# Patient Record
Sex: Male | Born: 1990 | Race: White | Hispanic: No | Marital: Single | Smoking: Never smoker
Health system: Southern US, Community
[De-identification: ages and names within clinical notes are randomized; demographics above are authoritative.]

## PROBLEM LIST (undated history)

## (undated) DIAGNOSIS — K209 Esophagitis, unspecified without bleeding: Secondary | ICD-10-CM

## (undated) DIAGNOSIS — F39 Unspecified mood [affective] disorder: Secondary | ICD-10-CM

## (undated) DIAGNOSIS — F909 Attention-deficit hyperactivity disorder, unspecified type: Secondary | ICD-10-CM

## (undated) HISTORY — DX: Unspecified mood (affective) disorder: F39

## (undated) HISTORY — DX: Esophagitis, unspecified without bleeding: K20.90

## (undated) HISTORY — DX: Attention-deficit hyperactivity disorder, unspecified type: F90.9

---

## 2019-09-08 DIAGNOSIS — K219 Gastro-esophageal reflux disease without esophagitis: Secondary | ICD-10-CM | POA: Diagnosis not present

## 2019-09-08 DIAGNOSIS — K580 Irritable bowel syndrome with diarrhea: Secondary | ICD-10-CM | POA: Diagnosis not present

## 2019-09-18 DIAGNOSIS — E559 Vitamin D deficiency, unspecified: Secondary | ICD-10-CM | POA: Diagnosis not present

## 2019-09-18 DIAGNOSIS — T7800XS Anaphylactic reaction due to unspecified food, sequela: Secondary | ICD-10-CM | POA: Diagnosis not present

## 2019-09-18 DIAGNOSIS — E291 Testicular hypofunction: Secondary | ICD-10-CM | POA: Diagnosis not present

## 2019-09-18 DIAGNOSIS — E349 Endocrine disorder, unspecified: Secondary | ICD-10-CM | POA: Diagnosis not present

## 2020-01-14 DIAGNOSIS — Z1331 Encounter for screening for depression: Secondary | ICD-10-CM | POA: Diagnosis not present

## 2020-01-14 DIAGNOSIS — F640 Transsexualism: Secondary | ICD-10-CM | POA: Diagnosis not present

## 2020-01-14 DIAGNOSIS — Z87890 Personal history of sex reassignment: Secondary | ICD-10-CM | POA: Diagnosis not present

## 2020-03-17 DIAGNOSIS — Z23 Encounter for immunization: Secondary | ICD-10-CM | POA: Diagnosis not present

## 2020-03-31 DIAGNOSIS — Z79818 Long term (current) use of other agents affecting estrogen receptors and estrogen levels: Secondary | ICD-10-CM | POA: Diagnosis not present

## 2020-03-31 DIAGNOSIS — K200 Eosinophilic esophagitis: Secondary | ICD-10-CM | POA: Diagnosis not present

## 2020-03-31 DIAGNOSIS — Z87890 Personal history of sex reassignment: Secondary | ICD-10-CM | POA: Diagnosis not present

## 2020-03-31 DIAGNOSIS — Z79899 Other long term (current) drug therapy: Secondary | ICD-10-CM | POA: Diagnosis not present

## 2020-04-19 DIAGNOSIS — Z1331 Encounter for screening for depression: Secondary | ICD-10-CM | POA: Diagnosis not present

## 2020-04-19 DIAGNOSIS — F909 Attention-deficit hyperactivity disorder, unspecified type: Secondary | ICD-10-CM | POA: Diagnosis not present

## 2020-04-19 DIAGNOSIS — D509 Iron deficiency anemia, unspecified: Secondary | ICD-10-CM | POA: Diagnosis not present

## 2020-04-19 DIAGNOSIS — R112 Nausea with vomiting, unspecified: Secondary | ICD-10-CM | POA: Diagnosis not present

## 2020-04-19 DIAGNOSIS — F649 Gender identity disorder, unspecified: Secondary | ICD-10-CM | POA: Diagnosis not present

## 2020-04-27 DIAGNOSIS — Z20828 Contact with and (suspected) exposure to other viral communicable diseases: Secondary | ICD-10-CM | POA: Diagnosis not present

## 2020-06-11 DIAGNOSIS — F649 Gender identity disorder, unspecified: Secondary | ICD-10-CM | POA: Diagnosis not present

## 2020-06-11 DIAGNOSIS — K929 Disease of digestive system, unspecified: Secondary | ICD-10-CM | POA: Diagnosis not present

## 2020-06-11 DIAGNOSIS — F909 Attention-deficit hyperactivity disorder, unspecified type: Secondary | ICD-10-CM | POA: Diagnosis not present

## 2020-06-11 DIAGNOSIS — D509 Iron deficiency anemia, unspecified: Secondary | ICD-10-CM | POA: Diagnosis not present

## 2020-08-25 DIAGNOSIS — Z79899 Other long term (current) drug therapy: Secondary | ICD-10-CM | POA: Diagnosis not present

## 2020-08-25 DIAGNOSIS — Z87890 Personal history of sex reassignment: Secondary | ICD-10-CM | POA: Diagnosis not present

## 2020-08-25 DIAGNOSIS — F641 Gender identity disorder in adolescence and adulthood: Secondary | ICD-10-CM | POA: Diagnosis not present

## 2020-08-25 DIAGNOSIS — Z79818 Long term (current) use of other agents affecting estrogen receptors and estrogen levels: Secondary | ICD-10-CM | POA: Diagnosis not present

## 2020-08-29 DIAGNOSIS — B029 Zoster without complications: Secondary | ICD-10-CM | POA: Diagnosis not present

## 2020-09-01 DIAGNOSIS — D509 Iron deficiency anemia, unspecified: Secondary | ICD-10-CM | POA: Diagnosis not present

## 2020-09-01 DIAGNOSIS — B029 Zoster without complications: Secondary | ICD-10-CM | POA: Diagnosis not present

## 2020-09-01 DIAGNOSIS — F909 Attention-deficit hyperactivity disorder, unspecified type: Secondary | ICD-10-CM | POA: Diagnosis not present

## 2020-09-01 DIAGNOSIS — F322 Major depressive disorder, single episode, severe without psychotic features: Secondary | ICD-10-CM | POA: Diagnosis not present

## 2020-12-06 DIAGNOSIS — Z79818 Long term (current) use of other agents affecting estrogen receptors and estrogen levels: Secondary | ICD-10-CM | POA: Diagnosis not present

## 2020-12-06 DIAGNOSIS — K200 Eosinophilic esophagitis: Secondary | ICD-10-CM | POA: Diagnosis not present

## 2020-12-06 DIAGNOSIS — Z79899 Other long term (current) drug therapy: Secondary | ICD-10-CM | POA: Diagnosis not present

## 2020-12-06 DIAGNOSIS — Z87890 Personal history of sex reassignment: Secondary | ICD-10-CM | POA: Diagnosis not present

## 2021-01-06 DIAGNOSIS — F064 Anxiety disorder due to known physiological condition: Secondary | ICD-10-CM | POA: Diagnosis not present

## 2021-01-06 DIAGNOSIS — F641 Gender identity disorder in adolescence and adulthood: Secondary | ICD-10-CM | POA: Diagnosis not present

## 2021-01-10 DIAGNOSIS — F909 Attention-deficit hyperactivity disorder, unspecified type: Secondary | ICD-10-CM | POA: Diagnosis not present

## 2021-01-10 DIAGNOSIS — D509 Iron deficiency anemia, unspecified: Secondary | ICD-10-CM | POA: Diagnosis not present

## 2021-01-10 DIAGNOSIS — F649 Gender identity disorder, unspecified: Secondary | ICD-10-CM | POA: Diagnosis not present

## 2021-01-10 DIAGNOSIS — Z87890 Personal history of sex reassignment: Secondary | ICD-10-CM | POA: Diagnosis not present

## 2021-02-14 DIAGNOSIS — F641 Gender identity disorder in adolescence and adulthood: Secondary | ICD-10-CM | POA: Diagnosis not present

## 2021-03-04 DIAGNOSIS — F641 Gender identity disorder in adolescence and adulthood: Secondary | ICD-10-CM | POA: Diagnosis not present

## 2021-04-01 DIAGNOSIS — F641 Gender identity disorder in adolescence and adulthood: Secondary | ICD-10-CM | POA: Diagnosis not present

## 2021-04-15 DIAGNOSIS — F641 Gender identity disorder in adolescence and adulthood: Secondary | ICD-10-CM | POA: Diagnosis not present

## 2021-04-18 DIAGNOSIS — Z79899 Other long term (current) drug therapy: Secondary | ICD-10-CM | POA: Diagnosis not present

## 2021-04-18 DIAGNOSIS — Z87890 Personal history of sex reassignment: Secondary | ICD-10-CM | POA: Diagnosis not present

## 2021-04-18 DIAGNOSIS — F641 Gender identity disorder in adolescence and adulthood: Secondary | ICD-10-CM | POA: Diagnosis not present

## 2021-04-18 DIAGNOSIS — Z79818 Long term (current) use of other agents affecting estrogen receptors and estrogen levels: Secondary | ICD-10-CM | POA: Diagnosis not present

## 2021-04-26 DIAGNOSIS — F649 Gender identity disorder, unspecified: Secondary | ICD-10-CM | POA: Diagnosis not present

## 2021-04-26 DIAGNOSIS — R1013 Epigastric pain: Secondary | ICD-10-CM | POA: Diagnosis not present

## 2021-04-26 DIAGNOSIS — D509 Iron deficiency anemia, unspecified: Secondary | ICD-10-CM | POA: Diagnosis not present

## 2021-04-26 DIAGNOSIS — F909 Attention-deficit hyperactivity disorder, unspecified type: Secondary | ICD-10-CM | POA: Diagnosis not present

## 2021-04-29 DIAGNOSIS — F641 Gender identity disorder in adolescence and adulthood: Secondary | ICD-10-CM | POA: Diagnosis not present

## 2021-05-09 DIAGNOSIS — F641 Gender identity disorder in adolescence and adulthood: Secondary | ICD-10-CM | POA: Diagnosis not present

## 2021-05-16 ENCOUNTER — Other Ambulatory Visit: Payer: Self-pay

## 2021-05-16 ENCOUNTER — Encounter: Payer: Self-pay | Admitting: Family Medicine

## 2021-05-16 ENCOUNTER — Ambulatory Visit (INDEPENDENT_AMBULATORY_CARE_PROVIDER_SITE_OTHER): Payer: BC Managed Care – PPO | Admitting: Family Medicine

## 2021-05-16 VITALS — BP 110/60 | HR 83 | Ht 65.5 in | Wt 220.0 lb

## 2021-05-16 DIAGNOSIS — F39 Unspecified mood [affective] disorder: Secondary | ICD-10-CM | POA: Diagnosis not present

## 2021-05-16 DIAGNOSIS — K209 Esophagitis, unspecified without bleeding: Secondary | ICD-10-CM

## 2021-05-16 DIAGNOSIS — R198 Other specified symptoms and signs involving the digestive system and abdomen: Secondary | ICD-10-CM | POA: Diagnosis not present

## 2021-05-16 DIAGNOSIS — Z789 Other specified health status: Secondary | ICD-10-CM | POA: Diagnosis not present

## 2021-05-16 DIAGNOSIS — F909 Attention-deficit hyperactivity disorder, unspecified type: Secondary | ICD-10-CM | POA: Insufficient documentation

## 2021-05-16 DIAGNOSIS — K2100 Gastro-esophageal reflux disease with esophagitis, without bleeding: Secondary | ICD-10-CM | POA: Insufficient documentation

## 2021-05-16 MED ORDER — SPIRONOLACTONE 100 MG PO TABS: 100.0000 mg | ORAL_TABLET | Freq: Every day | ORAL | 3 refills | Status: DC

## 2021-05-16 MED ORDER — ESTRADIOL VALERATE 20 MG/ML IM OIL: 20.0000 mg | TOPICAL_OIL | INTRAMUSCULAR | 12 refills | Status: DC

## 2021-05-16 MED ORDER — PANTOPRAZOLE SODIUM 40 MG PO TBEC: 40.0000 mg | DELAYED_RELEASE_TABLET | Freq: Every day | ORAL | 3 refills | Status: DC

## 2021-05-16 NOTE — Progress Notes (Signed)
ROI completed to [facility] ( Dr. Delman Cheadle). ROI given to [facility] in the front office to process. Christen Bame, CMA

## 2021-05-16 NOTE — Patient Instructions (Addendum)
It was wonderful to see you today.  Please bring ALL of your medications with you to every visit.   Today we talked about:   -Signing a release for [facility]  - Return in 1 month refill medications    Thank you for choosing [facility].   Please call [phone] with any questions about today's appointment.  Please be sure to schedule follow up at the front  desk before you leave today.   Dorris Singh, MD  Family Medicine

## 2021-05-16 NOTE — Progress Notes (Signed)
°  Patient Name: Sean Graves Date of Birth: [birth year] Date of Visit: 05/16/21 PCP: Sean Malay, MD  Chief Complaint: establish care   Subjective: Sean Graves is a pleasant 31 y.o. 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 Was seeing Sean Graves at [facility]. .   Preferred name: [name]  Pronouns: she/they  Patient goals for today: discuss overall health, review medications   Age at which began to identify body did not match gender: 9 (maybe even earlier)  Support system: Partner- Sean Graves, mom to some degree  People who know patient is out as transgender: family, friends   Any history of hormone therapy use: yes  Current Outpatient Medications:    estradiol valerate (DELESTROGEN) 20 MG/ML injection, Inject 1 mL (20 mg total) into the muscle every 28 (twenty-eight) days., Disp: 5 mL, Rfl: 12   pantoprazole (PROTONIX) 40 MG tablet, Take 1 tablet (40 mg total) by mouth daily., Disp: 30 tablet, Rfl: 3   spironolactone (ALDACTONE) 100 MG tablet, Take 1 tablet (100 mg total) by mouth at bedtime., Disp: 90 tablet, Rfl: 3  Does not tuck   Any history of gender affirming surgeries: no  Do you desire to undergo gender affirming surgery: yes- on wait list at [facility]  PMH:  MDD- follows with [facility]  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 And Eosinophilia eosphagitis  VTE- No  CAD- NO   PSH:  none  Family History:  Alcohol use in mother and father Depression in mother    Social History: Sexual orientation: male partner- Sean Graves  Tobacco use: no Alcohol use: minimal- alcohol use disorder runs in family  Other substances: no  Oldest of 2 other brothers Mother is somewhat supportive Father- has not seen since age 9 Step dad- not supportive  Partner is named Sean Graves 2 dogs, no cats  - Mental health: Sees [facility] - Adherence to medication: good  - Adverse reactions  to medication: patches and pills did not work   ROS:  ROS  + intermittent diarrhea and constipation NO melena No family history of IBD or colon cancer  Vitals:   05/16/21 1013  BP: 110/60  Pulse: 83  SpO2: 98%   Filed Weights   05/16/21 1013  Weight: 220 lb (99.8 kg)    Cardiac: Regular rate and rhythm. Normal S1/S2. No murmurs, rubs, or gallops appreciated. Lungs: Clear bilaterally to ascultation.  Abdomen: Normoactive bowel sounds. No tenderness to deep or light palpation. No rebound or guarding.   Psych: Pleasant and appropriate    Sean Graves was seen today for new patient (initial visit).  Diagnoses and all orders for this visit:  Male-to-male transgender person -     estradiol valerate (DELESTROGEN) 20 MG/ML injection; Inject 1 mL (20 mg total) into the muscle every 28 (twenty-eight) days. -     spironolactone (ALDACTONE) 100 MG tablet; Take 1 tablet (100 mg total) by mouth at bedtime.  Esophagitis -     pantoprazole (PROTONIX) 40 MG tablet; Take 1 tablet (40 mg total) by mouth daily.  Alternating constipation and diarrhea discussed, had previously referral to Gastroenterology.   Mood disorder ([place]) continue with therapy. Briefly discussed medications   Attention deficit hyperactivity disorder (ADHD), unspecified ADHD type previously on Vyvanse  HCM At follow up review vaccine records    Sean Singh, MD  [facility] Medicine Teaching Service

## 2021-05-23 DIAGNOSIS — F641 Gender identity disorder in adolescence and adulthood: Secondary | ICD-10-CM | POA: Diagnosis not present

## 2021-06-20 ENCOUNTER — Other Ambulatory Visit: Payer: Self-pay

## 2021-06-20 ENCOUNTER — Other Ambulatory Visit (HOSPITAL_COMMUNITY)
Admission: RE | Admit: 2021-06-20 | Discharge: 2021-06-20 | Disposition: A | Discharge status: Home / Self Care | Payer: BC Managed Care – PPO | Source: Ambulatory Visit | Attending: Family Medicine | Admitting: Family Medicine

## 2021-06-20 ENCOUNTER — Encounter: Payer: Self-pay | Admitting: Family Medicine

## 2021-06-20 ENCOUNTER — Ambulatory Visit (INDEPENDENT_AMBULATORY_CARE_PROVIDER_SITE_OTHER): Payer: BC Managed Care – PPO | Admitting: Family Medicine

## 2021-06-20 ENCOUNTER — Ambulatory Visit (HOSPITAL_COMMUNITY)
Admission: RE | Admit: 2021-06-20 | Discharge: 2021-06-20 | Disposition: A | Discharge status: Home / Self Care | Payer: BC Managed Care – PPO | Source: Ambulatory Visit | Attending: Family Medicine | Admitting: Family Medicine

## 2021-06-20 ENCOUNTER — Telehealth: Payer: Self-pay | Admitting: Family Medicine

## 2021-06-20 VITALS — BP 109/53 | HR 57 | Wt 222.4 lb

## 2021-06-20 DIAGNOSIS — Z113 Encounter for screening for infections with a predominantly sexual mode of transmission: Secondary | ICD-10-CM | POA: Insufficient documentation

## 2021-06-20 DIAGNOSIS — Z789 Other specified health status: Secondary | ICD-10-CM | POA: Diagnosis not present

## 2021-06-20 DIAGNOSIS — N50812 Left testicular pain: Secondary | ICD-10-CM

## 2021-06-20 DIAGNOSIS — N503 Cyst of epididymis: Secondary | ICD-10-CM | POA: Diagnosis not present

## 2021-06-20 DIAGNOSIS — Z5181 Encounter for therapeutic drug level monitoring: Secondary | ICD-10-CM | POA: Diagnosis not present

## 2021-06-20 DIAGNOSIS — R1032 Left lower quadrant pain: Secondary | ICD-10-CM

## 2021-06-20 DIAGNOSIS — K219 Gastro-esophageal reflux disease without esophagitis: Secondary | ICD-10-CM | POA: Insufficient documentation

## 2021-06-20 DIAGNOSIS — Z79899 Other long term (current) drug therapy: Secondary | ICD-10-CM | POA: Diagnosis not present

## 2021-06-20 MED ORDER — ESTRADIOL VALERATE 20 MG/ML IM OIL: TOPICAL_OIL | INTRAMUSCULAR | 2 refills | Status: DC

## 2021-06-20 MED ORDER — NAPROXEN 500 MG PO TABS: 500.0000 mg | ORAL_TABLET | Freq: Two times a day (BID) | ORAL | 0 refills | Status: DC

## 2021-06-20 MED ORDER — SPIRONOLACTONE 100 MG PO TABS: 100.0000 mg | ORAL_TABLET | Freq: Every day | ORAL | 3 refills | Status: DC

## 2021-06-20 NOTE — Telephone Encounter (Signed)
Called with results.

## 2021-06-20 NOTE — Patient Instructions (Addendum)
It was wonderful to see you today.  Please bring ALL of your medications with you to every visit.   Today we talked about:  --Getting an  ultrasound--I will call you with results  -- Going to the lab--I will call you with results  ---I sent your medications to your pharmacy   Go to the main entrance of [facility] by 1:15 PM--you can go to admitting and they will direct you to radiology    Thank you for choosing [facility].   Please call [phone] with any questions about today's appointment.  Please be sure to schedule follow up at the front  desk before you leave today.   Dorris Singh, MD  Family Medicine

## 2021-06-20 NOTE — Assessment & Plan Note (Signed)
Monitoring labs today, refills sent to pharmacy

## 2021-06-20 NOTE — Progress Notes (Signed)
° ° °  SUBJECTIVE:   CHIEF COMPLAINT: testicular pain HPI:   Sean Graves is a 31 y.o.  with history notable for GERD and MTF transgender status presenting for left testicle pain.  She has had testicular pain X2 weeks. Intermittent, diffuse. Started out more severe then went away. Has been intermittent since then. Uncertain trigger, no trauma. Does NOT tuck. No discharge. Has has vaginal intercourse with partner. Does intermittently have pain with erections. Is seeking surgical therapy with orchiectomy this summer. Cannot identify one specific area that is most painful.  She is somewhat disappointed in body changes with hormones.  Using spironolactone 100 mg, 0.3 mL of 100/5 Ml of estrogren. No complications. No symptoms of VTE. No family or personal history of VTE or CV disease. Does not smoke   PERTINENT  PMH / PSH/Family/Social History : updated and reviewed   OBJECTIVE:   BP (!) 109/53    Pulse (!) 57    Wt 100.9 kg    SpO2 99%    BMI 36.45 kg/m   Today's weight:  Last Weight  Most recent update: 06/20/2021 11:32 AM    Weight  100.9 kg (222 lb 6.4 oz)            Review of prior weights: Filed Weights   06/20/21 1131  Weight: 100.9 kg   RRR Lungs clear bilaterally  Chaperoned exam Sean Graves, CMA) + mild penile and testicular atrophy No TTP  Normal cremasteric reflex  No discharge or rash   ASSESSMENT/PLAN:   Male-to-male transgender person Monitoring labs today, refills sent to pharmacy     Testicular pain- possible epididymo-orchitis, given intermittent symptoms, testicular torsion still possible, Ordered stat ultrasound, will send to ER if absence of blood flow to L testicle. Has been ongoing for two weeks so does make this less likely but must be considered. No signs of rash suggestive of infection. GC/CT sent.   Healthcare maintenance records have been requested to obtain vaccine records.  She reports she was tested for all STIs before becoming sexually active  with her current partner.  Will await prior records before repeating.    Dorris Singh, [place]

## 2021-06-21 ENCOUNTER — Telehealth: Payer: Self-pay | Admitting: Family Medicine

## 2021-06-21 DIAGNOSIS — N452 Orchitis: Secondary | ICD-10-CM

## 2021-06-21 LAB — COMPREHENSIVE METABOLIC PANEL
ALT: 15 IU/L (ref 0–44)
AST: 20 IU/L (ref 0–40)
Albumin/Globulin Ratio: 1.8 (ref 1.2–2.2)
Albumin: 4.2 g/dL (ref 4.1–5.2)
Alkaline Phosphatase: 54 IU/L (ref 44–121)
BUN/Creatinine Ratio: 11 (ref 9–20)
BUN: 8 mg/dL (ref 6–20)
Bilirubin Total: <0.2 mg/dL (ref 0.0–1.2)
CO2: 20 mmol/L (ref 20–29)
Calcium: 8.8 mg/dL (ref 8.7–10.2)
Chloride: 102 mmol/L (ref 96–106)
Creatinine, Ser: 0.71 mg/dL — ABNORMAL LOW (ref 0.76–1.27)
Globulin, Total: 2.3 g/dL (ref 1.5–4.5)
Glucose: 84 mg/dL (ref 70–99)
Potassium: 4.6 mmol/L (ref 3.5–5.2)
Sodium: 135 mmol/L (ref 134–144)
Total Protein: 6.5 g/dL (ref 6.0–8.5)
eGFR: 128 mL/min/{1.73_m2} (ref >59)

## 2021-06-21 LAB — LIPID PANEL
Chol/HDL Ratio: 3.8 ratio (ref 0.0–5.0)
Cholesterol, Total: 160 mg/dL (ref 100–199)
HDL: 42 mg/dL (ref >39)
LDL Chol Calc (NIH): 99 mg/dL (ref 0–99)
Triglycerides: 101 mg/dL (ref 0–149)
VLDL Cholesterol Cal: 19 mg/dL (ref 5–40)

## 2021-06-21 LAB — URINE CYTOLOGY ANCILLARY ONLY
Chlamydia: NEGATIVE
Comment: NEGATIVE
Comment: NORMAL
Neisseria Gonorrhea: NEGATIVE

## 2021-06-21 LAB — TESTOSTERONE: Testosterone: 14 ng/dL — ABNORMAL LOW (ref 264–916)

## 2021-06-21 LAB — ESTRADIOL: Estradiol: 224 pg/mL — ABNORMAL HIGH (ref 7.6–42.6)

## 2021-06-21 MED ORDER — LEVOFLOXACIN 500 MG PO TABS: 500.0000 mg | ORAL_TABLET | Freq: Every day | ORAL | 0 refills | Status: DC

## 2021-06-21 NOTE — Telephone Encounter (Signed)
Called patient to discuss results.  Reviewed at length.  Given ongoing left testicular pain we will treat for colitis.  Given negative GC chlamydia and low risk we will treat for presumed enteric infection.  Discussed risks of Levaquin.  Prescribed for 7-day course.  She is to call regarding improvement or change in symptoms.

## 2021-06-29 ENCOUNTER — Encounter: Payer: Self-pay | Admitting: Family Medicine

## 2021-07-04 DIAGNOSIS — F641 Gender identity disorder in adolescence and adulthood: Secondary | ICD-10-CM | POA: Diagnosis not present

## 2021-07-25 ENCOUNTER — Other Ambulatory Visit: Payer: Self-pay

## 2021-07-25 ENCOUNTER — Encounter: Payer: Self-pay | Admitting: Family Medicine

## 2021-07-25 ENCOUNTER — Ambulatory Visit (INDEPENDENT_AMBULATORY_CARE_PROVIDER_SITE_OTHER): Payer: BC Managed Care – PPO | Admitting: Family Medicine

## 2021-07-25 VITALS — BP 125/74 | HR 67 | Wt 220.8 lb

## 2021-07-25 DIAGNOSIS — F909 Attention-deficit hyperactivity disorder, unspecified type: Secondary | ICD-10-CM | POA: Diagnosis not present

## 2021-07-25 DIAGNOSIS — F39 Unspecified mood [affective] disorder: Secondary | ICD-10-CM

## 2021-07-25 DIAGNOSIS — Z789 Other specified health status: Secondary | ICD-10-CM | POA: Diagnosis not present

## 2021-07-25 NOTE — Assessment & Plan Note (Signed)
Discussed at length.  Given positive MDQ referral to psychiatry for recommendations regarding best medication given the patient's comorbid symptoms. ?

## 2021-07-25 NOTE — Assessment & Plan Note (Signed)
Reviewed recent labs.  

## 2021-07-25 NOTE — Patient Instructions (Addendum)
It was wonderful to see you today. ? ?Please bring ALL of your medications with you to every visit.  ? ?Today we talked about: ? ?I have referred you to Psychiatry to further evaluate your concern. If you do not received a phone call about this appointment within 2 weeks, please call our office back at [phone]. Jazmin Hartsell coordinates our referrals and can assist you in this.  ? ? ?Dr. Francesca Jewett  ?Monday July 3rd ?2 PM ?[address]&& ?[place] [address] ? ? ?You also have a visit June 7th at 2 PM ? ?Please follow up in 2 months  ? ?Please let me know if testicular pain changes ?  ? ? ?Thank you for choosing [facility].  ? ?Please call [phone] with any questions about today's appointment. ? ?Please be sure to schedule follow up at the front  desk before you leave today.  ? ?Dorris Singh, MD  ?Family Medicine  ? ?

## 2021-07-25 NOTE — Assessment & Plan Note (Addendum)
Has moderate symptoms but I do wonder if anxiety is the greater contributor here.  See plan under mood disorder. ?

## 2021-07-25 NOTE — Progress Notes (Signed)
? ? ?  SUBJECTIVE:  ? ?CHIEF COMPLAINT: ADHD and mood ?HPI:  ? ?Sean Graves is a 31 y.o.  with history notable for MTF transgender status, mood disorder, and ADHD presenting for questions about medications. ? ?The patient has a history of mood disorder, diagnosed with early onset (?) MDD and GAD. MDQ is positive today in three areas. Also had diagnosis of ADHD since age 62. Had tics with Adderall. Finds her anxiety is biggest issue. Reports some days anxiety is so bad she cannot focus or leave cough. No SI or HI. She fluctuates from sadness to anxiety. ADHD was improved by medication. Has been off for several years. She is interested in starting medication for mood and ADHD in future.She is about to graduate with a degree in [employer] studies.  ? ?Patient has upcoming visits with [facility] for potential gender affirming surgeries. Finds significant benefit from hormones. Does notice when she is nadir of injections.  ? ?She reports her testicular pain is resolved but did recur much more mildly this weekend. No discharge or other new symptoms.  ? ?PERTINENT  PMH / PSH/Family/Social History : updated and reviewed as appropriate  ? ?OBJECTIVE:  ? ?BP 125/74   Pulse 67   Wt 220 lb 12.8 oz (100.2 kg)   SpO2 100%   BMI 36.18 kg/m?   ?Today's weight:  ?Last Weight  Most recent update: 07/25/2021  9:21 AM  ? ? Weight  ?100.2 kg (220 lb 12.8 oz)  ?      ? ?  ? ?Review of prior weights: ?Filed Weights  ? 07/25/21 0921  ?Weight: 220 lb 12.8 oz (100.2 kg)  ? ? ? ?Cardiac: Regular rate and rhythm. Normal S1/S2. No murmurs, rubs, or gallops appreciated. ?Lungs: Clear bilaterally to ascultation.  ?Psych: Pleasant and appropriate  ? ? ?ASSESSMENT/PLAN:  ? ?ADHD ?Has moderate symptoms but I do wonder if anxiety is the greater contributor here.  See plan under mood disorder. ? ?Mood disorder ([place]) ?Discussed at length.  Given positive MDQ referral to psychiatry for recommendations regarding best medication given the patient's comorbid  symptoms. ? ?Male-to-male transgender person ?Reviewed recent labs. ?Follow up in 2 months for labs ? ?Healthcare maintenance, had hep C and HIV testing  at prior institution. ? ? ? ?Dorris Singh, MD  ?Family Medicine Teaching Service  ?[facility]  ? ? ?

## 2021-07-26 ENCOUNTER — Telehealth: Payer: Self-pay | Admitting: *Deleted

## 2021-07-26 NOTE — Chronic Care Management (AMB) (Signed)
?  Care Management  ? ?Note ? ?07/26/2021 ?Name: Sean Graves MRN: [id number] DOB: April 04, 1991 ? ?Sean Graves is a 31 y.o. year old adult who is a primary care patient of Martyn Malay, MD. I reached out to [name] by phone today offer care coordination services.  ? ?Ms. Geng was given information about care management services today including:  ?Care management services include personalized support from designated clinical staff supervised by her physician, including individualized plan of care and coordination with other care providers ?24/7 contact phone numbers for assistance for urgent and routine care needs. ?The patient may stop care management services at any time by phone call to the office staff. ? ?Patient agreed to services and verbal consent obtained.  ? ?Follow up plan: ?Telephone appointment with care management team member scheduled for:08/17/21 ? ?Laverda Sorenson  ?Care Guide, Embedded Care Coordination ?[facility]  Care Management  ?Direct Dial: [phone] ? ?

## 2021-08-01 DIAGNOSIS — F641 Gender identity disorder in adolescence and adulthood: Secondary | ICD-10-CM | POA: Diagnosis not present

## 2021-08-15 DIAGNOSIS — F641 Gender identity disorder in adolescence and adulthood: Secondary | ICD-10-CM | POA: Diagnosis not present

## 2021-08-17 ENCOUNTER — Ambulatory Visit: Payer: BC Managed Care – PPO | Admitting: Licensed Clinical Social Worker

## 2021-08-17 NOTE — Chronic Care Management (AMB) (Signed)
?  Care Management  ? ?Social Work Visit Note ? ?08/17/2021 ?Name: Sean Graves MRN: [id number] DOB: February 04, 1991 ? ?Trindon Dorton is a 31 y.o. year old adult who sees Martyn Malay, MD for primary care. The care management team was consulted for assistance with care management and care coordination needs related to [facility] Resources   ? ?Patient was given the following information about care management and care coordination services today, agreed to services, and gave verbal consent: 1.care management/care coordination services include personalized support from designated clinical staff supervised by their physician, including individualized plan of care and coordination with other care providers 2. 24/7 contact phone numbers for assistance for urgent and routine care needs. 3. The patient may stop care management/care coordination services at any time by phone call to the office staff. ? ?Engaged with patient by telephone for initial visit in response to provider referral for social work chronic care management and care coordination services. ? ?Assessment: Review of patient history, allergies, and health status during evaluation of patient need for care management/care coordination services.   ? ?Interventions:  ?Patient interviewed and appropriate assessments performed ?Collaborated with clinical team regarding patient needs  ?Patient in need of a psychiatrist. Patient has an upcoming appointment to meet with a therapist or Psychiatrist. Patient was unsure.  ?SW emailed patient Psychology today website . Patient is looking for a psychiatrist who specializes with working with [facility] community.  SW also advised patient to contact [employer] to get listing of psychiatrist in network.  ?SW gave patient contact information if needed in the future. ? ?SDOH (Social Determinants of Health) assessments performed: Yes ?   ? ?Plan:  ?patient will work with BSW to address needs related to Mental Health Concerns  ?No further follow up  required: . ?Milus Height, BSW  ?Social Worker ?[facility] Care Management  ?[phone] ?  ? ? ? ? ? ? ? ? ? ? ? ? ? ? ?

## 2021-08-17 NOTE — Patient Instructions (Signed)
Visit Information ? ?Instructions:  ? ?Patient was given the following information about care management and care coordination services today, agreed to services, and gave verbal consent: 1.care management/care coordination services include personalized support from designated clinical staff supervised by their physician, including individualized plan of care and coordination with other care providers 2. 24/7 contact phone numbers for assistance for urgent and routine care needs. 3. The patient may stop care management/care coordination services at any time by phone call to the office staff. ? ?Patient verbalizes understanding of instructions and care plan provided today and agrees to view in [place]. Active MyChart status confirmed with patient.   ? ?No further follow up required: Patient has SW information if assistance is needed in the future. ? ?Milus Height, BSW  ?Social Worker ?[facility] Care Management  ?[phone] ?  ? ?  ?

## 2021-08-29 DIAGNOSIS — F641 Gender identity disorder in adolescence and adulthood: Secondary | ICD-10-CM | POA: Diagnosis not present

## 2021-09-08 ENCOUNTER — Telehealth: Payer: Self-pay | Admitting: Family Medicine

## 2021-09-08 NOTE — Telephone Encounter (Signed)
Received after-hours page from [phone].  Attempted to return phone call, however patient did not answer.  Left HIPAA safe voicemail.  Will retry in 5 minutes. ? ?Ezequiel Essex, MD ? ?

## 2021-09-08 NOTE — Telephone Encounter (Signed)
Was able to reach the patient on phone, confirmed patient DOB. ? ?Intermittent pain in right forefoot x 1.5 weeks, worsening in last 3 days. Patient concerned they may have stress fracture and asks for advice on care at urgent care vs ED.  ? ?- no blunt or penetrating trauma to area ?- pain with flexing toes ?- denies redness, local swelling, or warmth ?- skin of toes normal in color, denies dusky, blue or black toes ?- full ROM in toes ? ?Recommend [facility] appointment or urgent care along with elevation and ice to the area.  I do not believe this complaint warrants an ED visit as there is no apparent vascular compromise present. ? ?Ezequiel Essex, MD ? ?

## 2021-09-21 DIAGNOSIS — F641 Gender identity disorder in adolescence and adulthood: Secondary | ICD-10-CM | POA: Diagnosis not present

## 2021-10-03 ENCOUNTER — Encounter: Payer: Self-pay | Admitting: Family Medicine

## 2021-10-03 ENCOUNTER — Ambulatory Visit (INDEPENDENT_AMBULATORY_CARE_PROVIDER_SITE_OTHER): Payer: BC Managed Care – PPO | Admitting: Family Medicine

## 2021-10-03 VITALS — BP 122/70 | HR 74 | Ht 65.5 in | Wt 218.0 lb

## 2021-10-03 DIAGNOSIS — K2100 Gastro-esophageal reflux disease with esophagitis, without bleeding: Secondary | ICD-10-CM

## 2021-10-03 DIAGNOSIS — Z789 Other specified health status: Secondary | ICD-10-CM | POA: Diagnosis not present

## 2021-10-03 MED ORDER — "SYRINGE/NEEDLE (DISP) 23G X 1"" 1 ML MISC": 1 refills | Status: AC

## 2021-10-03 MED ORDER — "EASY TOUCH HYPODERMIC NEEDLE 25G X 5/8"" MISC": 3 refills | Status: AC

## 2021-10-03 NOTE — Assessment & Plan Note (Signed)
Monitoring labs ordered she will obtain them later this week.  She is to call for an appointment.  Refilled syringes and needles.  She is finding benefit from the medications and thus we will continue these at this time.  Will write letter for bottom surgery.

## 2021-10-03 NOTE — Assessment & Plan Note (Signed)
Doing well on PPI.  Will continue.  Consider transition to famotidine in future.

## 2021-10-03 NOTE — Progress Notes (Signed)
    SUBJECTIVE:   CHIEF COMPLAINT: letter for surgery  HPI:   Sean Graves is a 31 y.o.  with history notable for mood disorder and MTF transgender status  presenting for follow up and for a letter for surgery.  The patient has no particular concerns today.  She needs a refill on her syringes and needles.  She does report that she feels like she is having menstrual type symptoms at the end of her injection cycle.  She reports she is pleased overall with the injections and feels well.  She has a consultation with Dr. Francesca Jewett on 29 June for bottom surgery.  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  She is very pleased with this.  PERTINENT  PMH / PSH/Family/Social History : Updated and reviewed as appropriate no new family or medical history  OBJECTIVE:   BP 122/70   Pulse 74   Ht 5' 5.5" (1.664 m)   Wt 218 lb (98.9 kg)   SpO2 98%   BMI 35.73 kg/m   Today's weight:  Last Weight  Most recent update: 10/03/2021  8:25 AM    Weight  98.9 kg (218 lb)            Review of prior weights: [employer]   10/03/21 0825  Weight: 218 lb (98.9 kg)     Cardiac: Regular rate and rhythm. Normal S1/S2. No murmurs, rubs, or gallops appreciated. Lungs: Clear bilaterally to ascultation.  Psych: Pleasant and appropriate    ASSESSMENT/PLAN:   Reflux esophagitis Doing well on PPI.  Will continue.  Consider transition to famotidine in future.  Male-to-male transgender person Monitoring labs ordered she will obtain them later this week.  She is to call for an appointment.  Refilled syringes and needles.  She is finding benefit from the medications and thus we will continue these at this time.  Will write letter for bottom surgery.       Dorris Singh, [place]

## 2021-10-03 NOTE — Patient Instructions (Signed)
It was wonderful to see you today.  Please bring ALL of your medications with you to every visit.   Today we talked about:  --A letter for Dr. Francesca Jewett  - I will send this via [facility] - Return later this week for labs--please call to let us know the date you are coming in   - I sent in syringes with needles and your injecting needlese   Thank you for choosing [facility].   Please call [phone] with any questions about today's appointment.  Please be sure to schedule follow up at the front  desk before you leave today.   Dorris Singh, MD  Family Medicine

## 2021-10-04 ENCOUNTER — Encounter: Payer: Self-pay | Admitting: Family Medicine

## 2021-10-04 ENCOUNTER — Encounter: Payer: Self-pay | Admitting: *Deleted

## 2021-10-04 DIAGNOSIS — F641 Gender identity disorder in adolescence and adulthood: Secondary | ICD-10-CM | POA: Diagnosis not present

## 2021-10-04 LAB — HIV ANTIBODY (ROUTINE TESTING W REFLEX): HIV Screen 4th Generation wRfx: NONREACTIVE

## 2021-10-04 LAB — CBC
Hematocrit: 33.8 % — ABNORMAL LOW (ref 37.5–51.0)
Hemoglobin: 10.8 g/dL — ABNORMAL LOW (ref 13.0–17.7)
MCH: 26.9 pg (ref 26.6–33.0)
MCHC: 32 g/dL (ref 31.5–35.7)
MCV: 84 fL (ref 79–97)
Platelets: 282 10*3/uL (ref 150–450)
RBC: 4.02 x10E6/uL — ABNORMAL LOW (ref 4.14–5.80)
RDW: 13.2 % (ref 11.6–15.4)
WBC: 10.5 10*3/uL (ref 3.4–10.8)

## 2021-10-04 LAB — COMPREHENSIVE METABOLIC PANEL
ALT: 14 IU/L (ref 0–44)
AST: 15 IU/L (ref 0–40)
Albumin/Globulin Ratio: 1.6 (ref 1.2–2.2)
Albumin: 3.8 g/dL — ABNORMAL LOW (ref 4.1–5.2)
Alkaline Phosphatase: 68 IU/L (ref 44–121)
BUN/Creatinine Ratio: 10 (ref 9–20)
BUN: 8 mg/dL (ref 6–20)
Bilirubin Total: <0.2 mg/dL (ref 0.0–1.2)
CO2: 22 mmol/L (ref 20–29)
Calcium: 9.1 mg/dL (ref 8.7–10.2)
Chloride: 102 mmol/L (ref 96–106)
Creatinine, Ser: 0.82 mg/dL (ref 0.76–1.27)
Globulin, Total: 2.4 g/dL (ref 1.5–4.5)
Glucose: 108 mg/dL — ABNORMAL HIGH (ref 70–99)
Potassium: 4.2 mmol/L (ref 3.5–5.2)
Sodium: 138 mmol/L (ref 134–144)
Total Protein: 6.2 g/dL (ref 6.0–8.5)
eGFR: 123 mL/min/{1.73_m2} (ref >59)

## 2021-10-04 LAB — HCV INTERPRETATION

## 2021-10-04 LAB — TESTOSTERONE: Testosterone: 17 ng/dL — ABNORMAL LOW (ref 264–916)

## 2021-10-04 LAB — HCV AB W REFLEX TO QUANT PCR: HCV Ab: NONREACTIVE

## 2021-10-04 LAB — ESTRADIOL: Estradiol: 167 pg/mL — ABNORMAL HIGH (ref 7.6–42.6)

## 2021-10-06 ENCOUNTER — Other Ambulatory Visit: Payer: Self-pay | Admitting: Family Medicine

## 2021-10-06 DIAGNOSIS — D649 Anemia, unspecified: Secondary | ICD-10-CM

## 2021-10-17 ENCOUNTER — Ambulatory Visit (HOSPITAL_BASED_OUTPATIENT_CLINIC_OR_DEPARTMENT_OTHER): Payer: BC Managed Care – PPO | Admitting: Psychiatry

## 2021-10-17 ENCOUNTER — Encounter (HOSPITAL_COMMUNITY): Payer: Self-pay | Admitting: Psychiatry

## 2021-10-17 ENCOUNTER — Ambulatory Visit (HOSPITAL_COMMUNITY): Payer: Self-pay | Admitting: Psychiatry

## 2021-10-17 VITALS — Wt 218.0 lb

## 2021-10-17 DIAGNOSIS — F902 Attention-deficit hyperactivity disorder, combined type: Secondary | ICD-10-CM | POA: Diagnosis not present

## 2021-10-17 DIAGNOSIS — F411 Generalized anxiety disorder: Secondary | ICD-10-CM

## 2021-10-17 DIAGNOSIS — T1490XA Injury, unspecified, initial encounter: Secondary | ICD-10-CM

## 2021-10-17 DIAGNOSIS — F331 Major depressive disorder, recurrent, moderate: Secondary | ICD-10-CM

## 2021-10-17 MED ORDER — BUPROPION HCL ER (XL) 150 MG PO TB24: 150.0000 mg | ORAL_TABLET | Freq: Every day | ORAL | 0 refills | Status: DC

## 2021-10-17 MED ORDER — HYDROXYZINE PAMOATE 25 MG PO CAPS: 25.0000 mg | ORAL_CAPSULE | Freq: Every day | ORAL | 0 refills | Status: DC | PRN
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
## 2021-10-22 DIAGNOSIS — F641 Gender identity disorder in adolescence and adulthood: Secondary | ICD-10-CM | POA: Diagnosis not present

## 2021-10-24 DIAGNOSIS — Z6835 Body mass index (BMI) 35.0-35.9, adult: Secondary | ICD-10-CM | POA: Diagnosis not present

## 2021-10-24 DIAGNOSIS — F640 Transsexualism: Secondary | ICD-10-CM | POA: Diagnosis not present

## 2021-10-24 DIAGNOSIS — F649 Gender identity disorder, unspecified: Secondary | ICD-10-CM | POA: Diagnosis not present

## 2021-11-11 ENCOUNTER — Other Ambulatory Visit (HOSPITAL_COMMUNITY): Payer: Self-pay | Admitting: Psychiatry

## 2021-11-11 DIAGNOSIS — T1490XA Injury, unspecified, initial encounter: Secondary | ICD-10-CM

## 2021-11-11 DIAGNOSIS — F411 Generalized anxiety disorder: Secondary | ICD-10-CM

## 2021-11-11 DIAGNOSIS — F902 Attention-deficit hyperactivity disorder, combined type: Secondary | ICD-10-CM

## 2021-11-11 DIAGNOSIS — F331 Major depressive disorder, recurrent, moderate: Secondary | ICD-10-CM

## 2021-11-14 ENCOUNTER — Ambulatory Visit (HOSPITAL_COMMUNITY): Payer: Self-pay | Admitting: Psychiatry

## 2021-11-16 ENCOUNTER — Telehealth (HOSPITAL_BASED_OUTPATIENT_CLINIC_OR_DEPARTMENT_OTHER): Payer: BC Managed Care – PPO | Admitting: Psychiatry

## 2021-11-16 ENCOUNTER — Encounter (HOSPITAL_COMMUNITY): Payer: Self-pay | Admitting: Psychiatry

## 2021-11-16 DIAGNOSIS — F902 Attention-deficit hyperactivity disorder, combined type: Secondary | ICD-10-CM

## 2021-11-16 DIAGNOSIS — F411 Generalized anxiety disorder: Secondary | ICD-10-CM | POA: Diagnosis not present

## 2021-11-16 DIAGNOSIS — F331 Major depressive disorder, recurrent, moderate: Secondary | ICD-10-CM | POA: Diagnosis not present

## 2021-11-16 DIAGNOSIS — T1490XA Injury, unspecified, initial encounter: Secondary | ICD-10-CM | POA: Diagnosis not present

## 2021-11-16 MED ORDER — HYDROXYZINE PAMOATE 25 MG PO CAPS: 25.0000 mg | ORAL_CAPSULE | Freq: Every day | ORAL | 0 refills | Status: DC | PRN

## 2021-11-16 MED ORDER — BUPROPION HCL ER (XL) 300 MG PO TB24: 300.0000 mg | ORAL_TABLET | Freq: Every day | ORAL | 0 refills | Status: DC
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
## 2022-02-09 ENCOUNTER — Other Ambulatory Visit (HOSPITAL_COMMUNITY): Payer: Self-pay | Admitting: Psychiatry

## 2022-02-09 DIAGNOSIS — F331 Major depressive disorder, recurrent, moderate: Secondary | ICD-10-CM

## 2022-02-09 DIAGNOSIS — F902 Attention-deficit hyperactivity disorder, combined type: Secondary | ICD-10-CM

## 2022-02-09 DIAGNOSIS — Z62819 Personal history of unspecified abuse in childhood: Secondary | ICD-10-CM

## 2022-02-09 DIAGNOSIS — T1490XA Injury, unspecified, initial encounter: Secondary | ICD-10-CM

## 2022-02-09 DIAGNOSIS — F411 Generalized anxiety disorder: Secondary | ICD-10-CM

## 2022-02-15 ENCOUNTER — Telehealth (HOSPITAL_BASED_OUTPATIENT_CLINIC_OR_DEPARTMENT_OTHER): Payer: 59 | Admitting: Psychiatry

## 2022-02-15 ENCOUNTER — Encounter (HOSPITAL_COMMUNITY): Payer: Self-pay | Admitting: Psychiatry

## 2022-02-15 DIAGNOSIS — F902 Attention-deficit hyperactivity disorder, combined type: Secondary | ICD-10-CM | POA: Diagnosis not present

## 2022-02-15 DIAGNOSIS — F411 Generalized anxiety disorder: Secondary | ICD-10-CM

## 2022-02-15 DIAGNOSIS — F331 Major depressive disorder, recurrent, moderate: Secondary | ICD-10-CM | POA: Diagnosis not present

## 2022-02-15 DIAGNOSIS — T1490XA Injury, unspecified, initial encounter: Secondary | ICD-10-CM

## 2022-02-15 DIAGNOSIS — R69 Illness, unspecified: Secondary | ICD-10-CM | POA: Diagnosis not present

## 2022-02-15 MED ORDER — HYDROXYZINE PAMOATE 25 MG PO CAPS: 25.0000 mg | ORAL_CAPSULE | Freq: Every day | ORAL | 0 refills | Status: DC | PRN

## 2022-02-15 MED ORDER — BUPROPION HCL ER (XL) 300 MG PO TB24: 300.0000 mg | ORAL_TABLET | Freq: Every day | ORAL | 0 refills | Status: DC
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
## 2022-05-11 ENCOUNTER — Other Ambulatory Visit (HOSPITAL_COMMUNITY): Payer: Self-pay | Admitting: Psychiatry

## 2022-05-11 DIAGNOSIS — F411 Generalized anxiety disorder: Secondary | ICD-10-CM

## 2022-05-11 DIAGNOSIS — T1490XA Injury, unspecified, initial encounter: Secondary | ICD-10-CM

## 2022-05-11 DIAGNOSIS — F902 Attention-deficit hyperactivity disorder, combined type: Secondary | ICD-10-CM

## 2022-05-11 DIAGNOSIS — F331 Major depressive disorder, recurrent, moderate: Secondary | ICD-10-CM

## 2022-05-15 ENCOUNTER — Other Ambulatory Visit (HOSPITAL_COMMUNITY): Payer: Self-pay | Admitting: *Deleted

## 2022-05-15 DIAGNOSIS — F902 Attention-deficit hyperactivity disorder, combined type: Secondary | ICD-10-CM

## 2022-05-15 DIAGNOSIS — T1490XA Injury, unspecified, initial encounter: Secondary | ICD-10-CM

## 2022-05-15 DIAGNOSIS — F411 Generalized anxiety disorder: Secondary | ICD-10-CM

## 2022-05-15 DIAGNOSIS — F331 Major depressive disorder, recurrent, moderate: Secondary | ICD-10-CM

## 2022-05-15 DIAGNOSIS — Z62819 Personal history of unspecified abuse in childhood: Secondary | ICD-10-CM

## 2022-05-15 MED ORDER — BUPROPION HCL ER (XL) 300 MG PO TB24: 300.0000 mg | ORAL_TABLET | Freq: Every day | ORAL | 0 refills | Status: DC

## 2022-05-17 ENCOUNTER — Telehealth (HOSPITAL_COMMUNITY): Payer: 59 | Admitting: Psychiatry

## 2022-05-27 ENCOUNTER — Other Ambulatory Visit (HOSPITAL_COMMUNITY): Payer: Self-pay | Admitting: Psychiatry

## 2022-05-27 DIAGNOSIS — F411 Generalized anxiety disorder: Secondary | ICD-10-CM

## 2022-05-27 DIAGNOSIS — T1490XA Injury, unspecified, initial encounter: Secondary | ICD-10-CM

## 2022-05-27 DIAGNOSIS — F331 Major depressive disorder, recurrent, moderate: Secondary | ICD-10-CM

## 2022-05-27 DIAGNOSIS — F902 Attention-deficit hyperactivity disorder, combined type: Secondary | ICD-10-CM

## 2022-05-30 ENCOUNTER — Telehealth (HOSPITAL_BASED_OUTPATIENT_CLINIC_OR_DEPARTMENT_OTHER): Payer: Self-pay | Admitting: Psychiatry

## 2022-05-30 ENCOUNTER — Encounter (HOSPITAL_COMMUNITY): Payer: Self-pay | Admitting: Psychiatry

## 2022-05-30 DIAGNOSIS — F331 Major depressive disorder, recurrent, moderate: Secondary | ICD-10-CM

## 2022-05-30 DIAGNOSIS — F902 Attention-deficit hyperactivity disorder, combined type: Secondary | ICD-10-CM

## 2022-05-30 DIAGNOSIS — F411 Generalized anxiety disorder: Secondary | ICD-10-CM

## 2022-05-30 DIAGNOSIS — T1490XA Injury, unspecified, initial encounter: Secondary | ICD-10-CM

## 2022-05-30 MED ORDER — BUPROPION HCL ER (XL) 300 MG PO TB24: 300.0000 mg | ORAL_TABLET | Freq: Every day | ORAL | 0 refills | Status: DC

## 2022-05-30 MED ORDER — HYDROXYZINE PAMOATE 25 MG PO CAPS: 25.0000 mg | ORAL_CAPSULE | Freq: Every day | ORAL | 0 refills | Status: DC | PRN
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
## 2022-06-21 ENCOUNTER — Ambulatory Visit: Payer: Medicaid Other | Admitting: Family Medicine

## 2022-06-26 ENCOUNTER — Ambulatory Visit: Payer: Medicaid Other | Admitting: Student

## 2022-06-26 ENCOUNTER — Encounter: Payer: Self-pay | Admitting: Student

## 2022-06-26 VITALS — BP 120/72 | HR 68 | Ht 65.5 in | Wt 218.0 lb

## 2022-06-26 DIAGNOSIS — Z5181 Encounter for therapeutic drug level monitoring: Secondary | ICD-10-CM | POA: Diagnosis not present

## 2022-06-26 DIAGNOSIS — D229 Melanocytic nevi, unspecified: Secondary | ICD-10-CM

## 2022-06-26 DIAGNOSIS — Z789 Other specified health status: Secondary | ICD-10-CM

## 2022-06-26 DIAGNOSIS — Z79899 Other long term (current) drug therapy: Secondary | ICD-10-CM

## 2022-06-26 MED ORDER — ESTRADIOL VALERATE 20 MG/ML IM OIL: TOPICAL_OIL | INTRAMUSCULAR | 2 refills | Status: DC

## 2022-06-26 NOTE — Assessment & Plan Note (Signed)
Monitoring labs ordered today (estradiol and testosterone). Stable and doing well on current HRT regimen. Continues to benefit from them.

## 2022-06-26 NOTE — Progress Notes (Unsigned)
    SUBJECTIVE:   CHIEF COMPLAINT / HPI:   Sean Graves is a pleasant 32 year-old with history of mood disorder and MTF transgender status presenting for hormone level check.  She has no acute concerns today.   PERTINENT  PMH / PSH: ***  OBJECTIVE:   There were no vitals taken for this visit. ***   ASSESSMENT/PLAN:   Male-to-male transgender person Monitoring labs ordered today (estradiol and testosterone). Stable and doing well on current HRT regimen. Continues to benefit from them.     Orvis Brill, Ali Molina    {    This will disappear when note is signed, click to select method of visit    :1}

## 2022-06-26 NOTE — Patient Instructions (Signed)
It was great seeing you today.  I refilled your medicine.  We collected testing today- I will call you if it is abnormal. If your results are normal, I will send you a MyChart message.   If you have any questions or concerns, please feel free to call the clinic.   Have a wonderful day,  Dr. Orvis Brill [facility] [phone]

## 2022-06-26 NOTE — Progress Notes (Deleted)
    SUBJECTIVE:   CHIEF COMPLAINT / HPI:   [place] Lost health insurance-  Approved for Medicaid  PERTINENT  PMH / PSH:   OBJECTIVE:   BP 120/72   Pulse 68   Ht 5' 5.5 (1.664 m)   Wt 218 lb (98.9 kg)   SpO2 96%   BMI 35.73 kg/m     ASSESSMENT/PLAN:   Male-to-male transgender person Monitoring labs ordered today (estradiol  and testosterone ). Stable and doing well on current HRT regimen. Continues to benefit from them.    Nachman Sundt, DO [facility]    {    This will disappear when note is signed, click to select method of visit    :1}

## 2022-06-27 DIAGNOSIS — D229 Melanocytic nevi, unspecified: Secondary | ICD-10-CM | POA: Insufficient documentation

## 2022-06-27 LAB — TESTOSTERONE: Testosterone: 12 ng/dL — ABNORMAL LOW (ref 264–916)

## 2022-06-27 LAB — ESTRADIOL: Estradiol: 261 pg/mL — ABNORMAL HIGH (ref 7.6–42.6)

## 2022-06-27 NOTE — Assessment & Plan Note (Signed)
No concerning features for malignancy Patient will monitor for any skin changes

## 2022-07-31 ENCOUNTER — Other Ambulatory Visit: Payer: Self-pay

## 2022-07-31 DIAGNOSIS — K209 Esophagitis, unspecified without bleeding: Secondary | ICD-10-CM

## 2022-07-31 MED ORDER — PANTOPRAZOLE SODIUM 40 MG PO TBEC: 40.0000 mg | DELAYED_RELEASE_TABLET | Freq: Every day | ORAL | 3 refills | Status: DC

## 2022-08-11 ENCOUNTER — Other Ambulatory Visit: Payer: Self-pay

## 2022-08-11 DIAGNOSIS — Z789 Other specified health status: Secondary | ICD-10-CM

## 2022-08-11 MED ORDER — SPIRONOLACTONE 100 MG PO TABS: 100.0000 mg | ORAL_TABLET | Freq: Every day | ORAL | 3 refills | Status: DC

## 2022-08-25 ENCOUNTER — Other Ambulatory Visit (HOSPITAL_COMMUNITY): Payer: Self-pay | Admitting: Psychiatry

## 2022-08-25 DIAGNOSIS — F411 Generalized anxiety disorder: Secondary | ICD-10-CM

## 2022-08-25 DIAGNOSIS — F331 Major depressive disorder, recurrent, moderate: Secondary | ICD-10-CM

## 2022-08-25 DIAGNOSIS — T1490XA Injury, unspecified, initial encounter: Secondary | ICD-10-CM

## 2022-08-25 DIAGNOSIS — F902 Attention-deficit hyperactivity disorder, combined type: Secondary | ICD-10-CM

## 2022-08-29 ENCOUNTER — Telehealth (HOSPITAL_BASED_OUTPATIENT_CLINIC_OR_DEPARTMENT_OTHER): Payer: Medicaid Other | Admitting: Psychiatry

## 2022-08-29 ENCOUNTER — Encounter (HOSPITAL_COMMUNITY): Payer: Self-pay | Admitting: Psychiatry

## 2022-08-29 DIAGNOSIS — T1490XA Injury, unspecified, initial encounter: Secondary | ICD-10-CM | POA: Diagnosis not present

## 2022-08-29 DIAGNOSIS — F331 Major depressive disorder, recurrent, moderate: Secondary | ICD-10-CM | POA: Diagnosis not present

## 2022-08-29 DIAGNOSIS — F411 Generalized anxiety disorder: Secondary | ICD-10-CM

## 2022-08-29 DIAGNOSIS — F902 Attention-deficit hyperactivity disorder, combined type: Secondary | ICD-10-CM | POA: Diagnosis not present

## 2022-08-29 MED ORDER — BUPROPION HCL ER (XL) 300 MG PO TB24
300.0000 mg | ORAL_TABLET | Freq: Every day | ORAL | 0 refills | Status: DC
Start: 2022-08-29 — End: 2022-11-28

## 2022-08-29 MED ORDER — HYDROXYZINE PAMOATE 25 MG PO CAPS: 25.0000 mg | ORAL_CAPSULE | Freq: Every day | ORAL | 0 refills | Status: DC | PRN
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
## 2022-11-09 ENCOUNTER — Ambulatory Visit: Payer: Medicaid Other | Admitting: Family Medicine

## 2022-11-13 ENCOUNTER — Encounter: Payer: Self-pay | Admitting: Family Medicine

## 2022-11-13 ENCOUNTER — Ambulatory Visit: Payer: Medicaid Other | Admitting: Family Medicine

## 2022-11-13 VITALS — BP 117/76 | HR 71 | Ht 65.0 in | Wt 228.4 lb

## 2022-11-13 DIAGNOSIS — Z789 Other specified health status: Secondary | ICD-10-CM | POA: Diagnosis present

## 2022-11-13 DIAGNOSIS — M545 Low back pain, unspecified: Secondary | ICD-10-CM | POA: Diagnosis not present

## 2022-11-13 NOTE — Patient Instructions (Addendum)
Thank you for coming in today!  Things we discussed today: For your low back pain, alternate over the counter tylenol and ibuprofen as needed. You can continue to use ice (or heat) as well. Also try the stretches on the handout (attached) 2. Take your spironolactone in the morning and see if this helps with getting up in the middle of the night to use the bathroom.  3. We will check your E and T levels today as well as your electrolytes. 4. Remember to take an iron supplement!  Please make an appointment to be seen in 3 months.  Have a great day!

## 2022-11-13 NOTE — Assessment & Plan Note (Addendum)
Intermittent back pain that is worse with physical activity and better with rest and ice likely to be musculoskeletal etiology. No red flag symptoms. Alternate ibuprofen and tylenol as needed, stretching, rest, and ice/heat Reviewed return precautions.

## 2022-11-13 NOTE — Assessment & Plan Note (Addendum)
Doing well on estradiol injections Take spironolactone in the AM to decrease nighttime urination Check estradiol and testosterone levels (q3 months) If T levels remain low and still having excessive urination, can consider decreasing spironolactone dose  Annual CMP today Follow up in 3 months

## 2022-11-13 NOTE — Progress Notes (Signed)
    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  Cardiovascular:     Rate and Rhythm: Normal rate and regular rhythm.     Heart sounds: Normal heart sounds, S1 normal and S2 normal.  Pulmonary:     Effort: Pulmonary effort is normal.     Breath sounds: Normal breath sounds and air entry.  Musculoskeletal:     Cervical back: Normal. No tenderness.     Thoracic back: Normal. No tenderness.     Lumbar back: Tenderness present.     Comments: Mild tenderness to palpation at L4/L5 Negative straight leg raise bilaterally  Neurological:     Mental Status: She is alert.     Gait: Gait is intact.      ASSESSMENT/PLAN:   Male-to-male transgender person Doing well on estradiol injections Take spironolactone in the AM to decrease nighttime urination Check estradiol and testosterone levels (q3 months) If T levels remain low and still having excessive urination, can consider decreasing spironolactone dose  Annual CMP today Follow up in 3  months  Low back pain Intermittent back pain that is worse with physical activity and better with rest and ice likely to be musculoskeletal etiology. No red flag symptoms. Alternate ibuprofen and tylenol as needed, stretching, rest, and ice/heat Reviewed return precautions.    Patient Instructions  Thank you for coming in today!  Things we discussed today: For your low back pain, alternate over the counter tylenol and ibuprofen as needed. You can continue to use ice (or heat) as well. Also try the stretches on the handout (attached) 2. Take your spironolactone in the morning and see if this helps with getting up in the middle of the night to use the bathroom.  3. We will check your E and T levels today as well as your electrolytes. 4. Remember to take an iron supplement!  Please make an appointment to be seen in 3 months.  Have a great day!    Lorayne Bender, MD [facility]

## 2022-11-14 LAB — COMPREHENSIVE METABOLIC PANEL
ALT: 13 IU/L (ref 0–44)
AST: 15 IU/L (ref 0–40)
Albumin: 4.2 g/dL — ABNORMAL LOW (ref 4.3–5.2)
Alkaline Phosphatase: 65 IU/L (ref 44–121)
BUN/Creatinine Ratio: 10 (ref 9–20)
BUN: 9 mg/dL (ref 6–20)
Bilirubin Total: <0.2 mg/dL (ref 0.0–1.2)
CO2: 22 mmol/L (ref 20–29)
Calcium: 9.2 mg/dL (ref 8.7–10.2)
Chloride: 102 mmol/L (ref 96–106)
Creatinine, Ser: 0.89 mg/dL (ref 0.76–1.27)
Globulin, Total: 2.2 g/dL (ref 1.5–4.5)
Glucose: 97 mg/dL (ref 70–99)
Potassium: 4.6 mmol/L (ref 3.5–5.2)
Sodium: 139 mmol/L (ref 134–144)
Total Protein: 6.4 g/dL (ref 6.0–8.5)
eGFR: 118 mL/min/{1.73_m2} (ref >59)

## 2022-11-14 LAB — TESTOSTERONE: Testosterone: 25 ng/dL — ABNORMAL LOW (ref 264–916)

## 2022-11-14 LAB — ESTRADIOL: Estradiol: 218 pg/mL — ABNORMAL HIGH (ref 7.6–42.6)

## 2022-11-26 ENCOUNTER — Other Ambulatory Visit (HOSPITAL_COMMUNITY): Payer: Self-pay | Admitting: Psychiatry

## 2022-11-26 DIAGNOSIS — F902 Attention-deficit hyperactivity disorder, combined type: Secondary | ICD-10-CM

## 2022-11-26 DIAGNOSIS — T1490XA Injury, unspecified, initial encounter: Secondary | ICD-10-CM

## 2022-11-26 DIAGNOSIS — F411 Generalized anxiety disorder: Secondary | ICD-10-CM

## 2022-11-26 DIAGNOSIS — F331 Major depressive disorder, recurrent, moderate: Secondary | ICD-10-CM

## 2022-11-27 ENCOUNTER — Other Ambulatory Visit: Payer: Self-pay

## 2022-11-27 DIAGNOSIS — K209 Esophagitis, unspecified without bleeding: Secondary | ICD-10-CM

## 2022-11-27 MED ORDER — PANTOPRAZOLE SODIUM 40 MG PO TBEC: 40.0000 mg | DELAYED_RELEASE_TABLET | Freq: Every day | ORAL | 3 refills | Status: DC

## 2022-11-28 ENCOUNTER — Telehealth (HOSPITAL_BASED_OUTPATIENT_CLINIC_OR_DEPARTMENT_OTHER): Payer: Medicaid Other | Admitting: Psychiatry

## 2022-11-28 ENCOUNTER — Encounter (HOSPITAL_COMMUNITY): Payer: Self-pay | Admitting: Psychiatry

## 2022-11-28 VITALS — Wt 228.0 lb

## 2022-11-28 DIAGNOSIS — F902 Attention-deficit hyperactivity disorder, combined type: Secondary | ICD-10-CM | POA: Diagnosis not present

## 2022-11-28 DIAGNOSIS — F411 Generalized anxiety disorder: Secondary | ICD-10-CM

## 2022-11-28 DIAGNOSIS — F331 Major depressive disorder, recurrent, moderate: Secondary | ICD-10-CM | POA: Diagnosis not present

## 2022-11-28 DIAGNOSIS — T1490XA Injury, unspecified, initial encounter: Secondary | ICD-10-CM

## 2022-11-28 MED ORDER — BUPROPION HCL ER (XL) 300 MG PO TB24
300.0000 mg | ORAL_TABLET | Freq: Every day | ORAL | 0 refills | Status: DC
Start: 2022-11-28 — End: 2022-11-28

## 2022-11-28 MED ORDER — BUPROPION HCL ER (XL) 300 MG PO TB24
300.0000 mg | ORAL_TABLET | Freq: Every day | ORAL | 1 refills | Status: DC
Start: 2022-11-28 — End: 2023-03-26

## 2022-11-28 MED ORDER — HYDROXYZINE PAMOATE 25 MG PO CAPS
25.0000 mg | ORAL_CAPSULE | Freq: Every day | ORAL | 0 refills | Status: DC | PRN
Start: 2022-11-28 — End: 2022-11-28

## 2022-11-28 MED ORDER — HYDROXYZINE PAMOATE 25 MG PO CAPS
25.0000 mg | ORAL_CAPSULE | Freq: Every day | ORAL | 1 refills | Status: DC | PRN
Start: 2022-11-28 — End: 2023-05-31
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
## 2022-12-15 ENCOUNTER — Other Ambulatory Visit: Payer: Self-pay | Admitting: Student

## 2022-12-15 DIAGNOSIS — Z5181 Encounter for therapeutic drug level monitoring: Secondary | ICD-10-CM

## 2023-03-23 NOTE — Progress Notes (Unsigned)
SUBJECTIVE:   CHIEF COMPLAINT: medic check HPI:   Sean Graves is a 32 y.o.  with history notable for ADHD presenting for follow up.  She reports overall she is doing okay.  She is working 60 hours a week and 2 part-time jobs.  She does not have benefits.  Her dogs and pets are doing well.  She currently has 1 partner and has had no change in partners.  The patient does report worsening back pain.  This has been stable but ongoing going since January.  It last for several days at a time and then subsides.  She takes only Tylenol for pain.  Excess activity makes it worse.  She sits most of the day at work. The patient denies anesthesia in the saddle area, bowel/bladder incontinence, fever.   The patient reports intermittent headaches.  This has been ongoing since age 71.  Previously they occurred once a month now they are 1-2 times a week.  No nausea vomiting photophobia phonophobia or symptoms at night.  Her vision was checked today.  She does have glasses but they have been broken and has not really been wearing them.  She has significant screen time at work. No numbness or weakness. Using only tylenol for pain.  She reports compliance with her medications.  She uses injections once a week.  No new side effects.  She is not interested in fertility preservation at this time.  She is having no side effects no new family history.  She is very aware of the increased risk of venous thromboembolism and possible cardiovascular disease.  She finds significant benefit from these medications.  PERTINENT  PMH / PSH/Family/Social History : ADHD Mood disorder she is not currently seeing a counselor.  We discussed the benefit of this in light of social stressors  Non-smoker no alcohol use OBJECTIVE:   BP 128/68   Pulse 61   Ht 5\' 5"  (1.651 m)   Wt 229 lb 6.4 oz (104.1 kg)   SpO2 97%   BMI 38.17 kg/m   Today's weight:  Last Weight  Most recent update: 03/26/2023  8:48 AM    Weight  104.1 kg (229  lb 6.4 oz)            Review of prior weights: Filed Weights   03/26/23 0847  Weight: 229 lb 6.4 oz (104.1 kg)   Back exam normal in appearance without rash.  Pain was localized on the paraspinal muscles bilaterally in the lumbar and paraspinal region.  Good forward flexion and extension no pain with extension but some pain with forward flexion.  Normal strength and reflexes in her lower extremities.  Regular rate and rhythm no murmurs rubs or gallops Lungs clear  Neuro: CN II: PERRL CN III, IV,VI: EOMI CV V: Normal sensation in V1, V2, V3 CVII: Symmetric smile and brow raise CN VIII: Normal hearing CN IX,X: Symmetric palate raise  CN XI: 5/5 shoulder shrug CN XII: Symmetric tongue protrusion     ASSESSMENT/PLAN:   Assessment & Plan Mood disorder (HCC) Refilled Wellbutrin today.  She is doing well.  Discussed benefits of seeing a counselor. 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  Medications refilled as she wishes to continue with the medications.  We discussed the benefits, risks and long-term side effects of these medications. Tension-type headache, not intractable, unspecified chronicity pattern Differentials includes migraine headache, no neurologic symptoms and normal neuroexam consistent with  a tension type headache do not suspect medication overuse.  Do not suspect intracranial pathology at this time.  We discussed getting her vision checked as I suspect this could be contributing.  She should wear her glasses.  Naproxen prescribed to take up to twice a week we did discuss medication overuse headache. Esophagitis PPI refilled she is doing well from the standpoint.   Musculoskeletal, myofascial low back pain.  No suspicion for spinal stenosis, lumbar radiculopathy or space-occupying lesion.  We discussed stretches, exercise, weight loss stretches and exercises were  given.  Naproxen can also help with this. Discussed X-ray- patient deferred, will obtain if worsens.    Healthcare maintenance she has already received her flu and COVID vaccines.  Terisa Starr, MD  Family Medicine Teaching Service  [facility]

## 2023-03-26 ENCOUNTER — Encounter: Payer: Self-pay | Admitting: Family Medicine

## 2023-03-26 ENCOUNTER — Ambulatory Visit (INDEPENDENT_AMBULATORY_CARE_PROVIDER_SITE_OTHER): Payer: Medicaid Other | Admitting: Family Medicine

## 2023-03-26 VITALS — BP 128/68 | HR 61 | Ht 65.0 in | Wt 229.4 lb

## 2023-03-26 DIAGNOSIS — G44209 Tension-type headache, unspecified, not intractable: Secondary | ICD-10-CM | POA: Diagnosis not present

## 2023-03-26 DIAGNOSIS — Z789 Other specified health status: Secondary | ICD-10-CM

## 2023-03-26 DIAGNOSIS — F39 Unspecified mood [affective] disorder: Secondary | ICD-10-CM

## 2023-03-26 DIAGNOSIS — K209 Esophagitis, unspecified without bleeding: Secondary | ICD-10-CM | POA: Diagnosis not present

## 2023-03-26 DIAGNOSIS — F902 Attention-deficit hyperactivity disorder, combined type: Secondary | ICD-10-CM

## 2023-03-26 MED ORDER — PANTOPRAZOLE SODIUM 40 MG PO TBEC: 40.0000 mg | DELAYED_RELEASE_TABLET | Freq: Every day | ORAL | 3 refills | Status: DC

## 2023-03-26 MED ORDER — SPIRONOLACTONE 100 MG PO TABS: 100.0000 mg | ORAL_TABLET | Freq: Every day | ORAL | 3 refills | Status: DC

## 2023-03-26 MED ORDER — ESTRADIOL VALERATE 20 MG/ML IM OIL: TOPICAL_OIL | INTRAMUSCULAR | 4 refills | Status: DC

## 2023-03-26 MED ORDER — NAPROXEN 500 MG PO TABS: 500.0000 mg | ORAL_TABLET | Freq: Two times a day (BID) | ORAL | 0 refills | Status: DC | PRN

## 2023-03-26 MED ORDER — BUPROPION HCL ER (XL) 300 MG PO TB24
300.0000 mg | ORAL_TABLET | Freq: Every day | ORAL | 1 refills | Status: DC
Start: 2023-03-26 — End: 2023-11-23

## 2023-03-26 NOTE — Assessment & Plan Note (Signed)
Wellbutrin for this as well

## 2023-03-26 NOTE — Assessment & Plan Note (Signed)
Refilled Wellbutrin today.  She is doing well.  Discussed benefits of seeing a counselor.

## 2023-03-26 NOTE — Patient Instructions (Signed)
It was wonderful to see you today.  Please bring ALL of your medications with you to every visit.   Today we talked about: - Getting blood work  - If your back pain worsens, I recommend getting Xrays  - I sent in your medications and we discussed the risks including that estrogen could worsen headaches  I recommend an eye exam Limit caffeine Increase water   Staying active can help with your back pain  I will message you with results  HAVE A GREAT HOLIDAY with the PETS!   Please follow up in 6 months   Thank you for choosing [facility].   Please call [phone] with any questions about today's appointment.  Please be sure to schedule follow up at the front  desk before you leave today.   Terisa Starr, MD  Family Medicine

## 2023-03-26 NOTE — Assessment & Plan Note (Signed)
Monitor labs today.  Medications refilled as she wishes to continue with the medications.  We discussed the benefits, risks and long-term side effects of these medications.

## 2023-03-27 ENCOUNTER — Telehealth: Payer: Self-pay | Admitting: Family Medicine

## 2023-03-27 DIAGNOSIS — Z789 Other specified health status: Secondary | ICD-10-CM

## 2023-03-27 LAB — COMPREHENSIVE METABOLIC PANEL
ALT: 13 [IU]/L (ref 0–44)
AST: 17 [IU]/L (ref 0–40)
Albumin: 4.3 g/dL (ref 4.3–5.2)
Alkaline Phosphatase: 65 [IU]/L (ref 44–121)
BUN/Creatinine Ratio: 12 (ref 9–20)
BUN: 10 mg/dL (ref 6–20)
Bilirubin Total: <0.2 mg/dL (ref 0.0–1.2)
CO2: 21 mmol/L (ref 20–29)
Calcium: 8.9 mg/dL (ref 8.7–10.2)
Chloride: 102 mmol/L (ref 96–106)
Creatinine, Ser: 0.84 mg/dL (ref 0.76–1.27)
Globulin, Total: 2.3 g/dL (ref 1.5–4.5)
Glucose: 85 mg/dL (ref 70–99)
Potassium: 4.8 mmol/L (ref 3.5–5.2)
Sodium: 137 mmol/L (ref 134–144)
Total Protein: 6.6 g/dL (ref 6.0–8.5)
eGFR: 120 mL/min/{1.73_m2} (ref >59)

## 2023-03-27 LAB — LIPID PANEL
Chol/HDL Ratio: 4.1 {ratio} (ref 0.0–5.0)
Cholesterol, Total: 160 mg/dL (ref 100–199)
HDL: 39 mg/dL — ABNORMAL LOW (ref >39)
LDL Chol Calc (NIH): 101 mg/dL — ABNORMAL HIGH (ref 0–99)
Triglycerides: 106 mg/dL (ref 0–149)
VLDL Cholesterol Cal: 20 mg/dL (ref 5–40)

## 2023-03-27 LAB — CBC
Hematocrit: 34.3 % — ABNORMAL LOW (ref 37.5–51.0)
Hemoglobin: 10.5 g/dL — ABNORMAL LOW (ref 13.0–17.7)
MCH: 25.1 pg — ABNORMAL LOW (ref 26.6–33.0)
MCHC: 30.6 g/dL — ABNORMAL LOW (ref 31.5–35.7)
MCV: 82 fL (ref 79–97)
Platelets: 339 10*3/uL (ref 150–450)
RBC: 4.18 x10E6/uL (ref 4.14–5.80)
RDW: 14.4 % (ref 11.6–15.4)
WBC: 11.1 10*3/uL — ABNORMAL HIGH (ref 3.4–10.8)

## 2023-03-27 LAB — TESTOSTERONE: Testosterone: 16 ng/dL — ABNORMAL LOW (ref 264–916)

## 2023-03-27 LAB — ESTRADIOL: Estradiol: 260 pg/mL — ABNORMAL HIGH (ref 7.6–42.6)

## 2023-03-27 LAB — HEMOGLOBIN A1C
Est. average glucose Bld gHb Est-mCnc: 120 mg/dL
Hgb A1c MFr Bld: 5.8 % — ABNORMAL HIGH (ref 4.8–5.6)

## 2023-03-27 MED ORDER — ESTRADIOL VALERATE 20 MG/ML IM OIL: TOPICAL_OIL | INTRAMUSCULAR | 4 refills | Status: DC

## 2023-03-27 NOTE — Telephone Encounter (Signed)
Called patient  Discussed slightly elevated A1c in prediabetes range.  She is working on dietary and exercise changes.  Also discussed slightly elevated LDL.  She has been anemic for to her whole life she reports she previously took iron supplement she is vegan.  Needs a B12 iron panel and celiac testing at follow-up.  Also discussed slightly elevated estrogen level.  This is obtained at the perfect timing just 3-1/2 days after injection.  As such we will lower her dose slightly I recommended a repeat in 1 month.  She will call to schedule a visit in 1 month.

## 2023-04-03 ENCOUNTER — Ambulatory Visit: Payer: Medicaid Other | Admitting: Family Medicine

## 2023-04-03 ENCOUNTER — Encounter: Payer: Self-pay | Admitting: Family Medicine

## 2023-04-03 VITALS — BP 133/73 | HR 86 | Ht 65.0 in | Wt 233.4 lb

## 2023-04-03 DIAGNOSIS — S96912A Strain of unspecified muscle and tendon at ankle and foot level, left foot, initial encounter: Secondary | ICD-10-CM

## 2023-04-03 NOTE — Patient Instructions (Addendum)
It was wonderful to see you today! Thank you for choosing [facility].   Please bring ALL of your medications with you to every visit.   Today we talked about:  I think you have an ankle strain to do the uneven terrain or hiking in yesterday.  It likely will improve over the next week and should be completely resolved after 4 weeks.  During that time you can use naproxen twice per day for the next 5 days for pain relief and to reduce inflammation.  You can also use ice 15 to 20 minutes after walking.  I would like you to do some ankle exercises every day as tolerated to maintain her mobility.  Please listen to your body and if your ankle starts to hurt after walking at work or walking the dogs please rest, elevate and utilize ice.  He will likely be a slow gradation increase in your activities over the coming weeks.  Please follow up as needed for persistent symptoms and as scheduled with PCP  Call the clinic at [phone] if your symptoms worsen or you have any concerns.  Please be sure to schedule follow up at the front desk before you leave today.   Elberta Fortis, DO Family Medicine

## 2023-04-03 NOTE — Progress Notes (Signed)
    SUBJECTIVE:   CHIEF COMPLAINT / HPI:   L ankle injury Presents with left ankle pain after hiking for an extended period of time in flip-flops yesterday.  Reports that she got lost on uneven terrain and therefore walked around for an extended period of time.  Reports pain upon getting home and then again this morning upon going to work.  Denies instability but has to walk on her toe/side of her foot for comfort.  Taking naproxen for headache, wondering if she can take it for ankle pain as well.  Has not used ice.  Prior injury to the same ankle during college.  No other left ankle surgery.  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   PERTINENT  PMH / PSH: None pertinent  OBJECTIVE:   BP 133/73   Pulse 86   Ht 5\' 5"  (1.651 m)   Wt 233 lb 6.4 oz (105.9 kg)   SpO2 97%   BMI 38.84 kg/m    General: NAD, pleasant, able to participate in exam MSK: Full range of motion and no pain with dorsiflexion, plantarflexion, inversion and eversion.  No skin changes including ecchymosis or erythema noted.  Minimal edema around inferior lateral malleoli.  Negative anterior drawer and talar tilt test.  Sensation intact.  Good distal cap refill.  2+ dorsalis pedis pulse. Skin: warm and dry, no rashes noted Neuro: alert, no obvious focal deficits Psych: Normal affect and mood  ASSESSMENT/PLAN:   Assessment & Plan Strain of left ankle, initial encounter Exam is consistent with strain, low concern for bony injury or ligamentous tear.  Will start with conservative therapy including naproxen (already taking for headache), ice and daily stretches.  Likely to resolve in the next 4 to 6 weeks.  Offered work note but patient declined today. -May take naproxen 500 mg twice daily x 2-week for acute injury, may need earlier refill from PCP, advised to request as needed -Consider PT if not significantly improved in 4 to 6 weeks.   Dr. Elberta Fortis, DO  [facility]

## 2023-05-18 NOTE — Progress Notes (Unsigned)
    SUBJECTIVE:   CHIEF COMPLAINT: anemia HPI:   Aaron Malkin is a 33 y.o.  with history notable for vegan status and MtF transgender status presenting for follow up  She reports ***  Headaches ***  Backpain ***.   PERTINENT  PMH / PSH/Family/Social History : ADHD, elevated BMI, on IM estrogen and spironolactone   OBJECTIVE:   There were no vitals taken for this visit.  Today's weight:  Review of prior weights: There were no vitals filed for this visit.  ***  ASSESSMENT/PLAN:   Assessment & Plan    Terisa Starr, MD  Family Medicine Teaching Service  [facility]

## 2023-05-21 ENCOUNTER — Ambulatory Visit (INDEPENDENT_AMBULATORY_CARE_PROVIDER_SITE_OTHER): Payer: Medicaid Other | Admitting: Family Medicine

## 2023-05-21 ENCOUNTER — Encounter: Payer: Self-pay | Admitting: Family Medicine

## 2023-05-21 VITALS — BP 110/78 | HR 74 | Ht 65.0 in | Wt 230.0 lb

## 2023-05-21 DIAGNOSIS — G44209 Tension-type headache, unspecified, not intractable: Secondary | ICD-10-CM | POA: Diagnosis not present

## 2023-05-21 DIAGNOSIS — Z23 Encounter for immunization: Secondary | ICD-10-CM

## 2023-05-21 DIAGNOSIS — F39 Unspecified mood [affective] disorder: Secondary | ICD-10-CM

## 2023-05-21 DIAGNOSIS — D649 Anemia, unspecified: Secondary | ICD-10-CM

## 2023-05-21 DIAGNOSIS — M545 Low back pain, unspecified: Secondary | ICD-10-CM | POA: Diagnosis not present

## 2023-05-21 DIAGNOSIS — Z789 Other specified health status: Secondary | ICD-10-CM

## 2023-05-21 MED ORDER — NAPROXEN 500 MG PO TABS: 500.0000 mg | ORAL_TABLET | Freq: Two times a day (BID) | ORAL | 0 refills | Status: DC | PRN

## 2023-05-21 NOTE — Assessment & Plan Note (Signed)
Checking estradiol level today and will adjust injection dosage as indicated

## 2023-05-21 NOTE — Patient Instructions (Addendum)
It was wonderful to see you today.  Please bring ALL of your medications with you to every visit.   Today we talked about:  We are checking labs today. I will contact you by phone or MyChart in the next few days with the results.  You got the first dose of the HPV vaccine today. This is a 3 dose series. You can get the next dose in 1 month.  Please call around to find an optometrist who takes your insurance.  Hormone therapy can have potentially permanent effects on fertility. For fertility preservation (egg freezing and sperm banking) there are two really excellent providers in our area  [facility] [address] [place], [place] [address] Phone: [phone]    [facility] [phone] [place], [place]   I recommend you consider fertility preservation prior to initiating therapy.    Please follow up in 3 months   Thank you for choosing [facility].   Please call [phone] with any questions about today's appointment.  Please be sure to schedule follow up at the front  desk before you leave today.   Terisa Starr, MD  Family Medicine

## 2023-05-21 NOTE — Assessment & Plan Note (Signed)
Provided therapeutic listening and emphasized importance of bringing any mood-related concerns to [facility] providers if/when they arise. Continue daily Wellbutrin and PRN atarax.

## 2023-05-21 NOTE — Assessment & Plan Note (Signed)
Most likely musculoskeletal etiology, improved overall. Will continue to monitor.

## 2023-05-22 LAB — RETICULOCYTES: Retic Ct Pct: 1.9 % (ref 0.6–2.6)

## 2023-05-23 LAB — CBC
Hematocrit: 33.8 % — ABNORMAL LOW (ref 37.5–51.0)
Hemoglobin: 10.4 g/dL — ABNORMAL LOW (ref 13.0–17.7)
MCH: 24.9 pg — ABNORMAL LOW (ref 26.6–33.0)
MCHC: 30.8 g/dL — ABNORMAL LOW (ref 31.5–35.7)
MCV: 81 fL (ref 79–97)
Platelets: 326 10*3/uL (ref 150–450)
RBC: 4.17 x10E6/uL (ref 4.14–5.80)
RDW: 13.9 % (ref 11.6–15.4)
WBC: 12.4 10*3/uL — ABNORMAL HIGH (ref 3.4–10.8)

## 2023-05-23 LAB — CELIAC DISEASE AB SCREEN W/RFX
Antigliadin Abs, IgA: 5 U (ref 0–19)
IgA/Immunoglobulin A, Serum: 224 mg/dL (ref 90–386)

## 2023-05-23 LAB — IRON,TIBC AND FERRITIN PANEL
Ferritin: 10 ng/mL — ABNORMAL LOW (ref 30–400)
Iron Saturation: 6 % — CL (ref 15–55)
Iron: 25 ug/dL — ABNORMAL LOW (ref 38–169)
Total Iron Binding Capacity: 435 ug/dL (ref 250–450)
UIBC: 410 ug/dL — ABNORMAL HIGH (ref 111–343)

## 2023-05-23 LAB — VITAMIN B12: Vitamin B-12: 223 pg/mL — ABNORMAL LOW (ref 232–1245)

## 2023-05-23 LAB — ESTRADIOL: Estradiol: 354 pg/mL — ABNORMAL HIGH (ref 7.6–42.6)

## 2023-05-24 ENCOUNTER — Encounter: Payer: Self-pay | Admitting: Family Medicine

## 2023-05-24 ENCOUNTER — Telehealth: Payer: Self-pay | Admitting: Family Medicine

## 2023-05-24 NOTE — Telephone Encounter (Signed)
Called and discussed. Not taking iron or B12. Started both. Recheck in 6 weeks.  Discussed going to GI if not improved.  Terisa Starr, MD  Family Medicine Teaching Service

## 2023-05-31 ENCOUNTER — Telehealth (HOSPITAL_COMMUNITY): Payer: Medicaid Other | Admitting: Psychiatry

## 2023-05-31 ENCOUNTER — Encounter (HOSPITAL_COMMUNITY): Payer: Self-pay | Admitting: Psychiatry

## 2023-05-31 DIAGNOSIS — F411 Generalized anxiety disorder: Secondary | ICD-10-CM

## 2023-05-31 DIAGNOSIS — F39 Unspecified mood [affective] disorder: Secondary | ICD-10-CM

## 2023-05-31 MED ORDER — HYDROXYZINE PAMOATE 25 MG PO CAPS: 25.0000 mg | ORAL_CAPSULE | Freq: Every day | ORAL | 0 refills | Status: DC | PRN
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
## 2023-06-21 ENCOUNTER — Ambulatory Visit: Payer: Medicaid Other

## 2023-06-25 ENCOUNTER — Telehealth: Payer: Self-pay

## 2023-06-25 ENCOUNTER — Ambulatory Visit: Payer: Medicaid Other | Admitting: *Deleted

## 2023-06-25 DIAGNOSIS — D508 Other iron deficiency anemias: Secondary | ICD-10-CM

## 2023-06-25 NOTE — Telephone Encounter (Signed)
 Patient came into nurse clinic today for second HPV vaccine.   Per [facility] review, I was able to locate state record for patient prior to legal name change. This record verified that patient had received HPV series. I have updated this in EPIC chart.   Patient is also asking about labs to recheck iron levels. Patient was advised on 05/24/23 to have labs checked in six weeks. Advised patient that I would call her once orders were placed and could then schedule for a lab visit.   Patient is appreciative.   Veronda Prude, RN

## 2023-06-25 NOTE — Telephone Encounter (Signed)
 Orders have been placed--please call patient and help schedule lab visit  Terisa Starr, MD  [facility] Medicine Teaching Service

## 2023-06-29 ENCOUNTER — Other Ambulatory Visit: Payer: Self-pay

## 2023-06-29 DIAGNOSIS — K209 Esophagitis, unspecified without bleeding: Secondary | ICD-10-CM

## 2023-06-29 MED ORDER — PANTOPRAZOLE SODIUM 40 MG PO TBEC: 40.0000 mg | DELAYED_RELEASE_TABLET | Freq: Every day | ORAL | 3 refills | Status: AC

## 2023-07-13 ENCOUNTER — Other Ambulatory Visit: Payer: Medicaid Other

## 2023-07-13 DIAGNOSIS — D508 Other iron deficiency anemias: Secondary | ICD-10-CM

## 2023-07-14 ENCOUNTER — Encounter: Payer: Self-pay | Admitting: Family Medicine

## 2023-07-14 LAB — CBC
Hematocrit: 35.3 % — ABNORMAL LOW (ref 37.5–51.0)
Hemoglobin: 11.1 g/dL — ABNORMAL LOW (ref 13.0–17.7)
MCH: 25.8 pg — ABNORMAL LOW (ref 26.6–33.0)
MCHC: 31.4 g/dL — ABNORMAL LOW (ref 31.5–35.7)
MCV: 82 fL (ref 79–97)
Platelets: 283 10*3/uL (ref 150–450)
RBC: 4.3 x10E6/uL (ref 4.14–5.80)
RDW: 15.7 % — ABNORMAL HIGH (ref 11.6–15.4)
WBC: 11.9 10*3/uL — ABNORMAL HIGH (ref 3.4–10.8)

## 2023-07-14 LAB — FERRITIN: Ferritin: 19 ng/mL — ABNORMAL LOW (ref 30–400)

## 2023-07-14 LAB — VITAMIN B12: Vitamin B-12: 334 pg/mL (ref 232–1245)

## 2023-07-30 IMAGING — US US SCROTUM W/ DOPPLER COMPLETE
1 series · 14 of 25 positions shown · non-contrast
Comparison: None.

CLINICAL DATA: Left testicular pain for 2 weeks.

EXAM:
SCROTAL ULTRASOUND
DOPPLER ULTRASOUND OF THE TESTICLES
TECHNIQUE: Complete ultrasound examination of the testicles, epididymis, and
other scrotal structures was performed. Color and spectral Doppler
ultrasound were also utilized to evaluate blood flow to the
testicles.

[Series 1: us scrotum doppler · 14 of 48 slices shown]
[im 1/48]
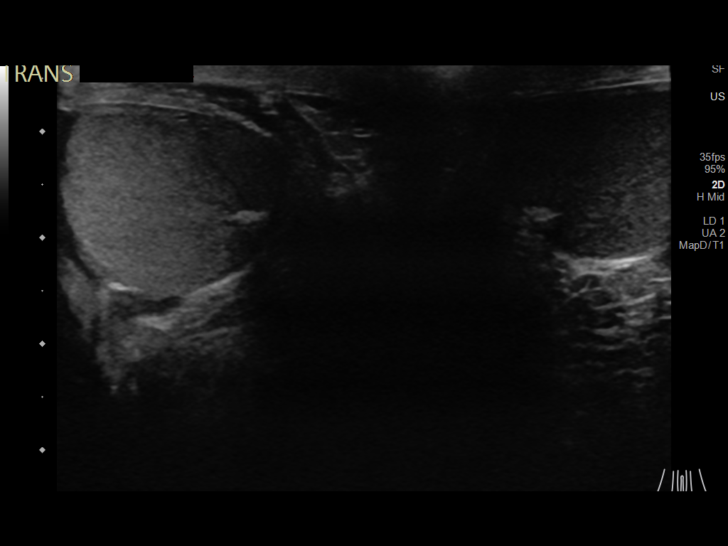
[im 4/48]
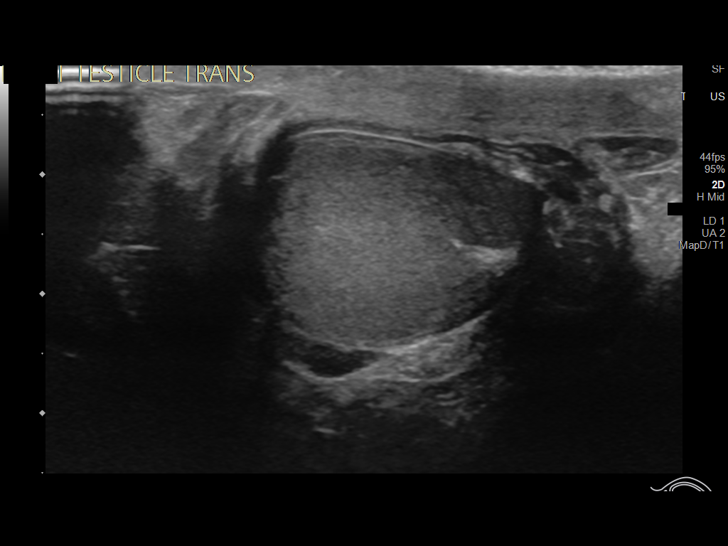
[im 8/48]
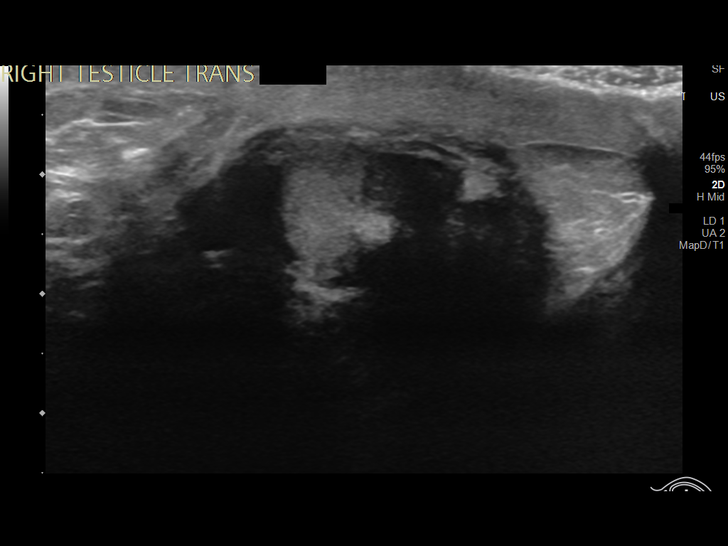
[im 12/48]
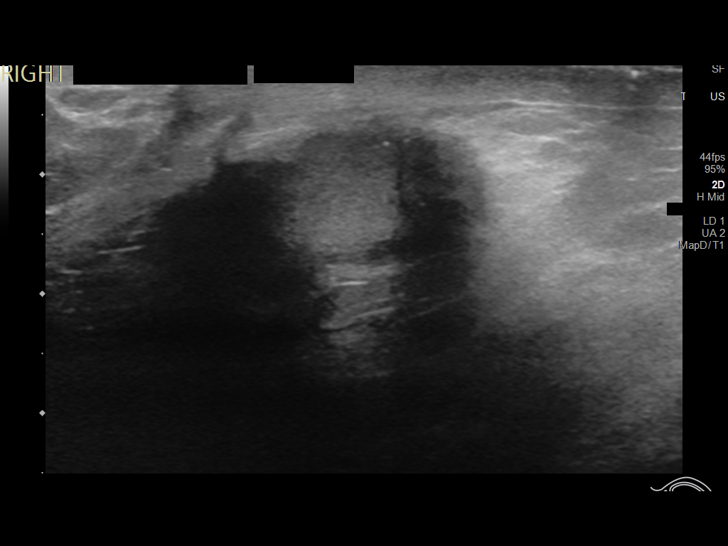
[im 16/48]
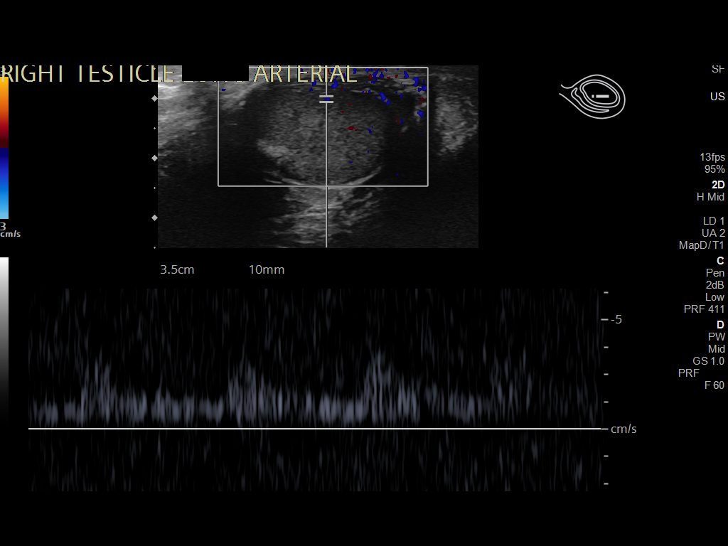
[im 18/48]
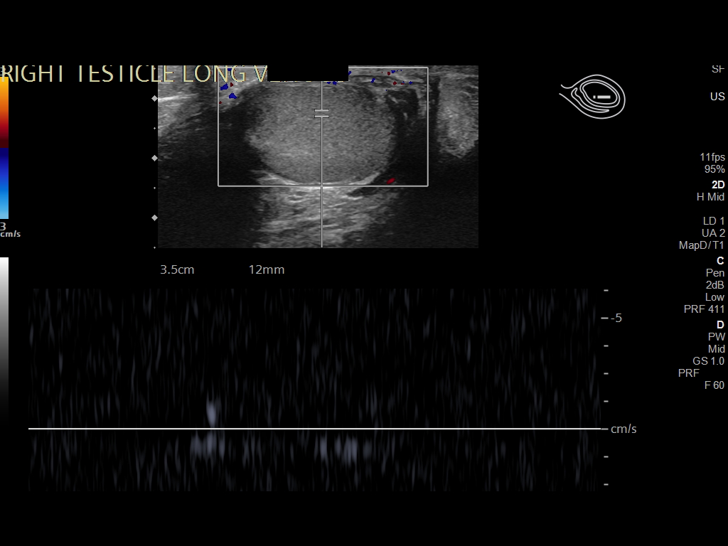
[im 22/48]
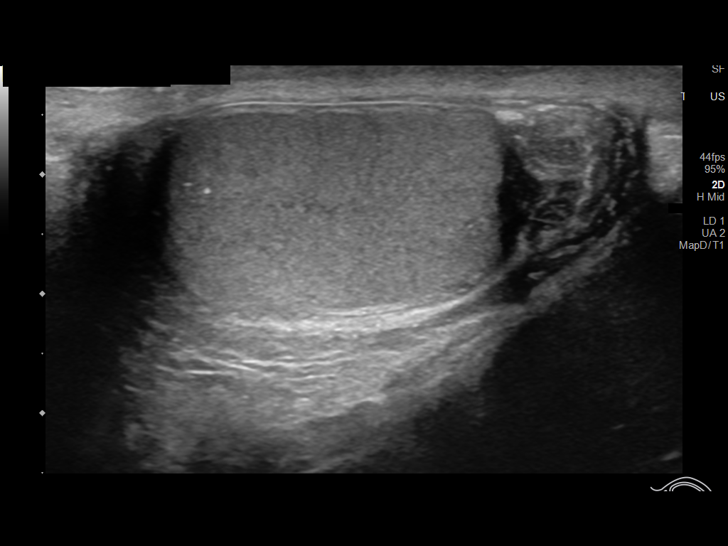
[im 26/48]
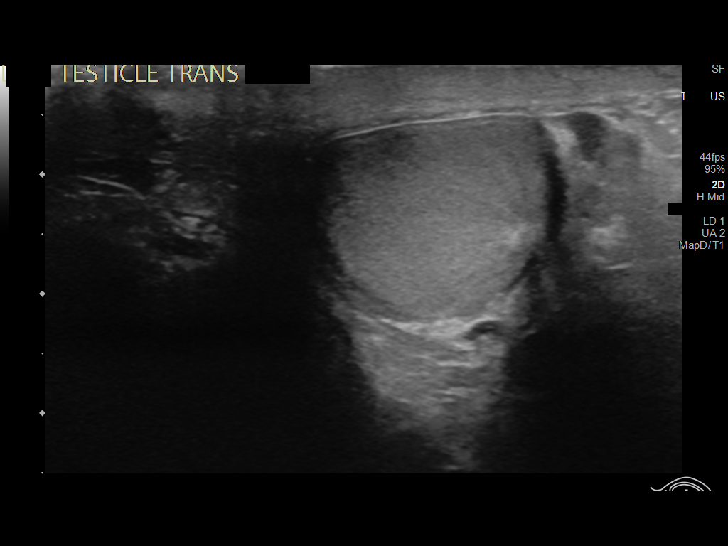
[im 30/48]
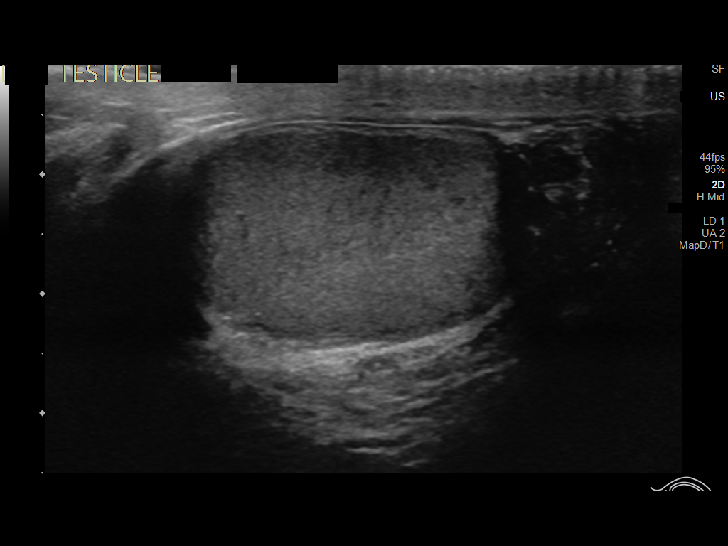
[im 32/48]
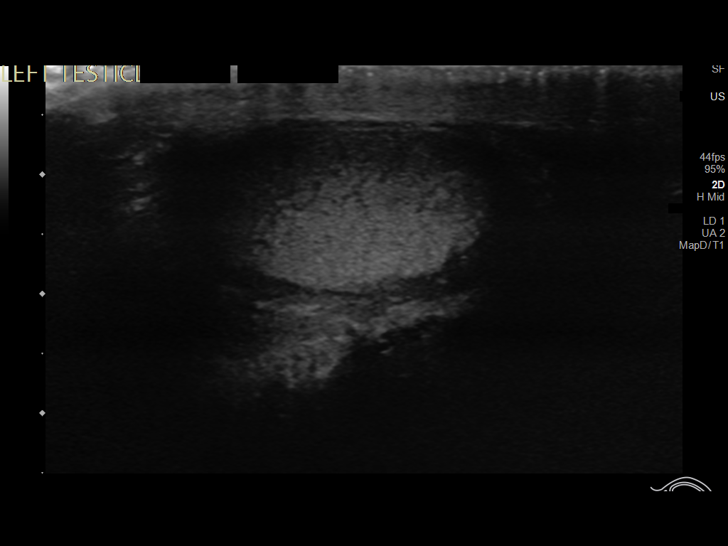
[im 36/48]
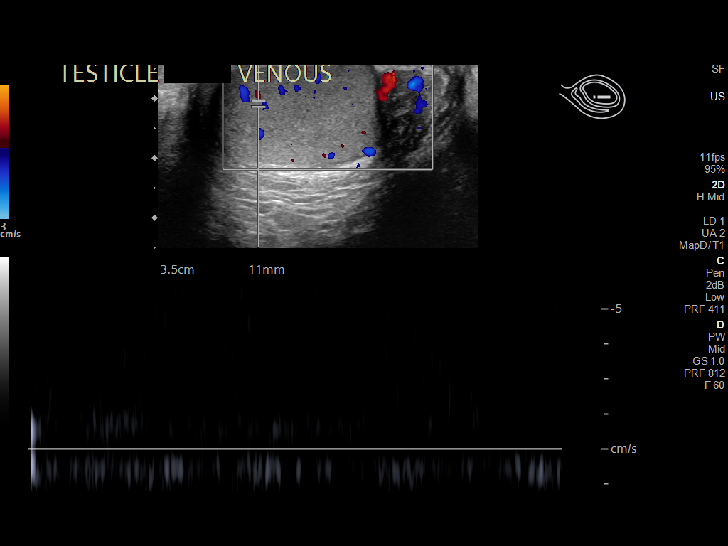
[im 40/48]
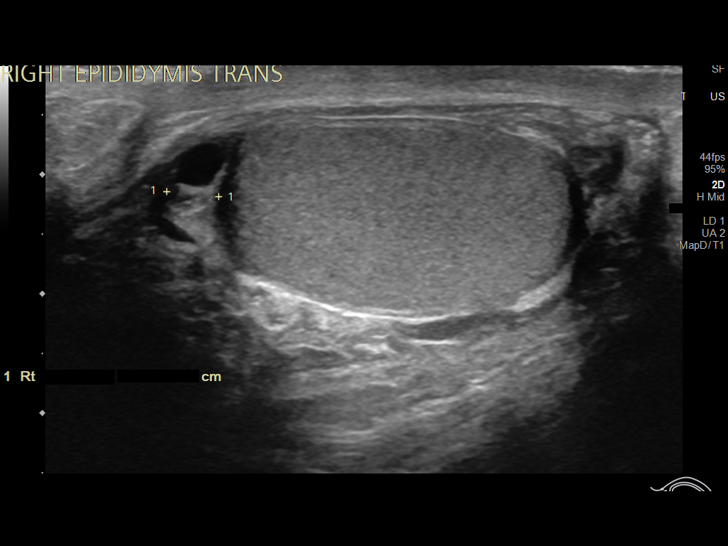
[im 44/48]
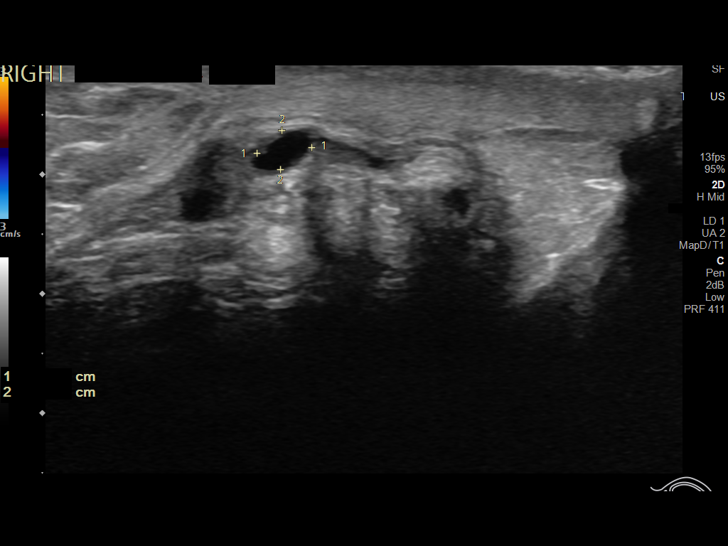
[im 48/48]
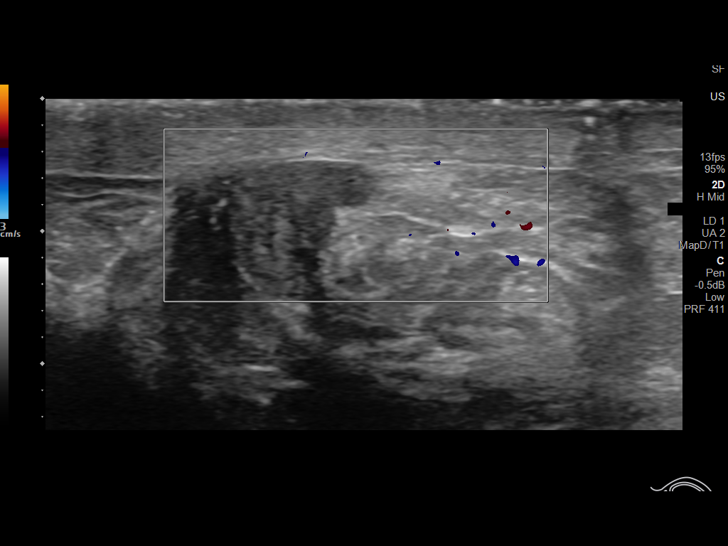

[14 of 25 positions shown; findings below may reference images not displayed]

FINDINGS: Right testicle

Measurements: 2.8 x 1.6 x 2.3 cm. No mass or microlithiasis
visualized.

Left testicle

Measurements: 2.8 x 1.8 x 2.0 cm. No mass or microlithiasis
visualized.

Right epididymis:  5 mm cystic focus in the epididymal head.

Left epididymis:  Normal in size and appearance.

Hydrocele:  None visualized.

Varicocele:  None visualized.

Pulsed Doppler interrogation of both testes demonstrates normal low
resistance arterial and venous waveforms bilaterally.
IMPRESSION: 5 mm right epididymal cyst or spermatocele, otherwise unremarkable
examination.

## 2023-08-01 ENCOUNTER — Other Ambulatory Visit: Payer: Self-pay | Admitting: Family Medicine

## 2023-08-01 DIAGNOSIS — G44209 Tension-type headache, unspecified, not intractable: Secondary | ICD-10-CM

## 2023-08-03 ENCOUNTER — Other Ambulatory Visit: Payer: Self-pay

## 2023-08-03 DIAGNOSIS — Z789 Other specified health status: Secondary | ICD-10-CM

## 2023-08-03 MED ORDER — SPIRONOLACTONE 100 MG PO TABS: 100.0000 mg | ORAL_TABLET | Freq: Every day | ORAL | 3 refills | Status: DC

## 2023-08-03 NOTE — Telephone Encounter (Signed)
 Please call patient and schedule for follow up in May/June

## 2023-09-13 NOTE — Progress Notes (Unsigned)
    SUBJECTIVE:   CHIEF COMPLAINT: check up HPI:   Sean Graves is a 33 y.o.  with history notable for MtF trasngender status presenting for follow up.   Off of work for the month. Doing a lot of reading. Low back pain and headcahes improved. She is not yet gotten her vision checked.  The patient reports compliance with her medications.  She wonders about reducing her spironolactone  as it causes her urinary frequency.  She does not like to take it during the day as think she then has to urinate all day.  She is very pleased with her transition and does not wish to stop medications.  She is understanding of the potential side effects and long-term risk for fertility and cardiovascular health.  She injects her estrogen on Fridays.   PERTINENT  PMH / PSH/Family/Social History : Updated and reviewed as appropriate.  OBJECTIVE:   BP 112/69   Pulse 63   Ht 5\' 5"  (1.651 m)   Wt 229 lb 3.2 oz (104 kg)   SpO2 100%   BMI 38.14 kg/m   Today's weight:  Last Weight  Most recent update: 09/14/2023 10:22 AM    Weight  104 kg (229 lb 3.2 oz)            Review of prior weights: [employer]   09/14/23 1021  Weight: 229 lb 3.2 oz (104 kg)    RRR Lungs clear  No edema   ASSESSMENT/PLAN:   Assessment & Plan Male-to-male transgender person After discussion of side effects of spironolactone  reduced to 50 mg.  Will obtain labs next week and then see how she feels on the reduced dose.  I discussed the risks, including possible increased risk of CVD and VTE, potential benefits, unknowns, and alternative to hormone therapy. We specifically discussed reproductive consequences and potential permanent adverse  effects on fertility.  Mood disorder (HCC) Doing well Iron deficiency anemia secondary to inadequate dietary iron intake Repeat B12 ferritin and CBC at upcoming lab visit as she is vegan and has a history of deficiency in both of these things.  She is currently taking  supplementation. Obesity (BMI 35.0-39.9 without comorbidity) Discussed increasing activity to improve low back pain.   Otho Blitz, MD  Family Medicine Teaching Service  [facility]

## 2023-09-14 ENCOUNTER — Ambulatory Visit: Admitting: Family Medicine

## 2023-09-14 ENCOUNTER — Encounter: Payer: Self-pay | Admitting: Family Medicine

## 2023-09-14 VITALS — BP 112/69 | HR 63 | Ht 65.0 in | Wt 229.2 lb

## 2023-09-14 DIAGNOSIS — F39 Unspecified mood [affective] disorder: Secondary | ICD-10-CM

## 2023-09-14 DIAGNOSIS — E669 Obesity, unspecified: Secondary | ICD-10-CM

## 2023-09-14 DIAGNOSIS — D508 Other iron deficiency anemias: Secondary | ICD-10-CM

## 2023-09-14 DIAGNOSIS — Z789 Other specified health status: Secondary | ICD-10-CM

## 2023-09-14 MED ORDER — SPIRONOLACTONE 50 MG PO TABS: 50.0000 mg | ORAL_TABLET | Freq: Every day | ORAL | 3 refills | Status: DC

## 2023-09-14 NOTE — Assessment & Plan Note (Signed)
 Doing well

## 2023-09-14 NOTE — Patient Instructions (Signed)
 It was wonderful to see you today.  Please bring ALL of your medications with you to every visit.   Today we talked about:  --Please return Monday for Labs  -- great work on your healthy  - Try to get 30 minutes of activity 5 times a week!!   Please follow up in 6  months   Thank you for choosing [facility].   Please call [phone] with any questions about today's appointment.  Please be sure to schedule follow up at the front  desk before you leave today.   Otho Blitz, MD  Family Medicine

## 2023-09-14 NOTE — Assessment & Plan Note (Signed)
 After discussion of side effects of spironolactone  reduced to 50 mg.  Will obtain labs next week and then see how she feels on the reduced dose.  I discussed the risks, including possible increased risk of CVD and VTE, potential benefits, unknowns, and alternative to hormone therapy. We specifically discussed reproductive consequences and potential permanent adverse  effects on fertility.

## 2023-09-17 ENCOUNTER — Other Ambulatory Visit: Payer: Self-pay

## 2023-09-17 DIAGNOSIS — Z789 Other specified health status: Secondary | ICD-10-CM

## 2023-09-17 DIAGNOSIS — D508 Other iron deficiency anemias: Secondary | ICD-10-CM

## 2023-09-18 ENCOUNTER — Ambulatory Visit: Payer: Self-pay | Admitting: Family Medicine

## 2023-09-18 LAB — CBC
Hematocrit: 37 % — ABNORMAL LOW (ref 37.5–51.0)
Hemoglobin: 11.9 g/dL — ABNORMAL LOW (ref 13.0–17.7)
MCH: 27.7 pg (ref 26.6–33.0)
MCHC: 32.2 g/dL (ref 31.5–35.7)
MCV: 86 fL (ref 79–97)
Platelets: 295 10*3/uL (ref 150–450)
RBC: 4.3 x10E6/uL (ref 4.14–5.80)
RDW: 14 % (ref 11.6–15.4)
WBC: 12 10*3/uL — ABNORMAL HIGH (ref 3.4–10.8)

## 2023-09-18 LAB — COMPREHENSIVE METABOLIC PANEL WITH GFR
ALT: 12 IU/L (ref 0–44)
AST: 16 IU/L (ref 0–40)
Albumin: 4.2 g/dL — ABNORMAL LOW (ref 4.3–5.2)
Alkaline Phosphatase: 58 IU/L (ref 44–121)
BUN/Creatinine Ratio: 13 (ref 9–20)
BUN: 10 mg/dL (ref 6–20)
Bilirubin Total: 0.3 mg/dL (ref 0.0–1.2)
CO2: 20 mmol/L (ref 20–29)
Calcium: 9.1 mg/dL (ref 8.7–10.2)
Chloride: 102 mmol/L (ref 96–106)
Creatinine, Ser: 0.77 mg/dL (ref 0.76–1.27)
Globulin, Total: 2.1 g/dL (ref 1.5–4.5)
Glucose: 91 mg/dL (ref 70–99)
Potassium: 4.6 mmol/L (ref 3.5–5.2)
Sodium: 136 mmol/L (ref 134–144)
Total Protein: 6.3 g/dL (ref 6.0–8.5)
eGFR: 124 mL/min/{1.73_m2} (ref >59)

## 2023-09-18 LAB — FERRITIN: Ferritin: 18 ng/mL — ABNORMAL LOW (ref 30–400)

## 2023-09-18 LAB — ESTRADIOL: Estradiol: 151 pg/mL — ABNORMAL HIGH (ref 7.6–42.6)

## 2023-09-18 LAB — TESTOSTERONE: Testosterone: 24 ng/dL — ABNORMAL LOW (ref 264–916)

## 2023-09-18 LAB — VITAMIN B12: Vitamin B-12: 304 pg/mL (ref 232–1245)

## 2023-09-25 ENCOUNTER — Other Ambulatory Visit (HOSPITAL_COMMUNITY): Payer: Self-pay | Admitting: Psychiatry

## 2023-09-25 DIAGNOSIS — F411 Generalized anxiety disorder: Secondary | ICD-10-CM

## 2023-11-22 ENCOUNTER — Other Ambulatory Visit (HOSPITAL_COMMUNITY): Payer: Self-pay | Admitting: Psychiatry

## 2023-11-22 DIAGNOSIS — F411 Generalized anxiety disorder: Secondary | ICD-10-CM

## 2023-11-23 ENCOUNTER — Other Ambulatory Visit: Payer: Self-pay

## 2023-11-23 DIAGNOSIS — F39 Unspecified mood [affective] disorder: Secondary | ICD-10-CM

## 2023-11-23 MED ORDER — BUPROPION HCL ER (XL) 300 MG PO TB24: 300.0000 mg | ORAL_TABLET | Freq: Every day | ORAL | 1 refills | Status: DC

## 2023-11-28 ENCOUNTER — Encounter (HOSPITAL_COMMUNITY): Payer: Self-pay | Admitting: Psychiatry

## 2023-11-28 ENCOUNTER — Telehealth (HOSPITAL_COMMUNITY): Payer: Medicaid Other | Admitting: Psychiatry

## 2023-11-28 DIAGNOSIS — F411 Generalized anxiety disorder: Secondary | ICD-10-CM | POA: Diagnosis not present

## 2023-11-28 DIAGNOSIS — F39 Unspecified mood [affective] disorder: Secondary | ICD-10-CM | POA: Diagnosis not present

## 2023-11-28 MED ORDER — HYDROXYZINE PAMOATE 25 MG PO CAPS: 25.0000 mg | ORAL_CAPSULE | Freq: Every day | ORAL | 1 refills | Status: DC
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
## 2023-12-14 NOTE — Progress Notes (Unsigned)
    SUBJECTIVE:   CHIEF COMPLAINT: discuss provera  HPI:   Sean Graves is a 33 y.o.  with history notable for MtF transgender status presenting for follow up.   Discussed the use of AI scribe software for clinical note transcription with the patient, who gave verbal consent to proceed.  History of Present Illness Sean Graves is a 33 year old male who presents for hormone management and blood work.  Hormone therapy management - Currently taking spironolactone , recently increased to 100 mg daily (was on this previously)  - Injected hormone therapy last Friday - Considering initiation of progesterone, weighing potential effects on mood and breast changes, CV risk - Denies side effects or adverse effects  - No new sexual partners   Mood symptoms - Initial increase in depression following spironolactone  dose increase, with subsequent improvement in mood - Continues Wellbutrin , which is beneficial for mood stabilization - Denies SI/HI - Back to work as Advertising copywriter   Lifestyle and exercise - Initiated a new at-home exercise routine - Contemplating resuming long-distance running    PERTINENT  PMH / PSH/Family/Social History :  Elevated BMI Mood disorder   OBJECTIVE:   BP (!) 125/59   Pulse 78   Ht 5' 5 (1.651 m)   Wt 231 lb 12.8 oz (105.1 kg)   SpO2 98%   BMI 38.57 kg/m   Today's weight:  Last Weight  Most recent update: 12/17/2023  8:54 AM    Weight  105.1 kg (231 lb 12.8 oz)            Review of prior weights: [employer]   12/17/23 0852  Weight: 231 lb 12.8 oz (105.1 kg)   RRR Lungs clear   ASSESSMENT/PLAN:   Assessment & Plan Male-to-male transgender person Discussed side effects, harms and benefits of starting Provera  Lowest dose started Follow up 3 months  Discussed mood symptoms  Sent increased dose of Spironolactone  Monitoring labs ordered  Mood disorder (HCC) Doing well, on Wellbutrin    Discussed low blood pressure (diastolic)  asymptomatic, has not had anything to eat today   Sean Daring, MD  Family Medicine Teaching Service  [facility]

## 2023-12-17 ENCOUNTER — Ambulatory Visit (INDEPENDENT_AMBULATORY_CARE_PROVIDER_SITE_OTHER): Admitting: Family Medicine

## 2023-12-17 ENCOUNTER — Encounter: Payer: Self-pay | Admitting: Family Medicine

## 2023-12-17 VITALS — BP 125/59 | HR 78 | Ht 65.0 in | Wt 231.8 lb

## 2023-12-17 DIAGNOSIS — Z789 Other specified health status: Secondary | ICD-10-CM

## 2023-12-17 DIAGNOSIS — F39 Unspecified mood [affective] disorder: Secondary | ICD-10-CM

## 2023-12-17 MED ORDER — SPIRONOLACTONE 100 MG PO TABS: 100.0000 mg | ORAL_TABLET | Freq: Every day | ORAL | 3 refills | Status: AC

## 2023-12-17 MED ORDER — MEDROXYPROGESTERONE ACETATE 2.5 MG PO TABS: 2.5000 mg | ORAL_TABLET | Freq: Every day | ORAL | 1 refills | Status: DC

## 2023-12-17 NOTE — Assessment & Plan Note (Signed)
 Doing well, on Wellbutrin 

## 2023-12-17 NOTE — Assessment & Plan Note (Signed)
 Discussed side effects, harms and benefits of starting Provera  Lowest dose started Follow up 3 months  Discussed mood symptoms  Sent increased dose of Spironolactone  Monitoring labs ordered

## 2023-12-17 NOTE — Patient Instructions (Signed)
 It was wonderful to see you today.  Please bring ALL of your medications with you to every visit.   Today we talked about:  - Starting provera   - Risks include potential increase in breast cancer and stroke/heart attack--this is from the [facility] Study  Return Monday for Labs  Thanks so much for seeing me!  Please follow up in 3 months   Thank you for choosing [facility].   Please call [phone] with any questions about today's appointment.  Please be sure to schedule follow up at the front  desk before you leave today.   Suzann Daring, MD  Family Medicine

## 2024-02-14 ENCOUNTER — Other Ambulatory Visit: Payer: Self-pay

## 2024-02-14 DIAGNOSIS — G44209 Tension-type headache, unspecified, not intractable: Secondary | ICD-10-CM

## 2024-02-14 MED ORDER — MEDROXYPROGESTERONE ACETATE 2.5 MG PO TABS
2.5000 mg | ORAL_TABLET | Freq: Every day | ORAL | 1 refills | Status: AC
Start: 2024-02-14 — End: ?

## 2024-02-14 MED ORDER — NAPROXEN 500 MG PO TABS: 500.0000 mg | ORAL_TABLET | Freq: Two times a day (BID) | ORAL | 0 refills | Status: AC | PRN

## 2024-04-15 ENCOUNTER — Other Ambulatory Visit: Payer: Self-pay | Admitting: *Deleted

## 2024-04-16 MED ORDER — MEDROXYPROGESTERONE ACETATE 2.5 MG PO TABS: 2.5000 mg | ORAL_TABLET | Freq: Every day | ORAL | 1 refills | Status: AC

## 2024-05-06 ENCOUNTER — Other Ambulatory Visit: Payer: Self-pay | Admitting: *Deleted

## 2024-05-06 DIAGNOSIS — Z789 Other specified health status: Secondary | ICD-10-CM

## 2024-05-07 MED ORDER — ESTRADIOL VALERATE 20 MG/ML IM OIL: TOPICAL_OIL | INTRAMUSCULAR | 0 refills | Status: AC

## 2024-05-07 NOTE — Telephone Encounter (Signed)
 Hi Risk Manager,  Please schedule this patient for follow up with me for follow up in in the next 1 months.  Thanks, Suzann Daring, MD  [facility] Medicine Teaching Service

## 2024-05-20 ENCOUNTER — Telehealth (HOSPITAL_COMMUNITY): Payer: Self-pay

## 2024-05-20 DIAGNOSIS — F39 Unspecified mood [affective] disorder: Secondary | ICD-10-CM

## 2024-05-20 MED ORDER — BUPROPION HCL ER (XL) 300 MG PO TB24: 300.0000 mg | ORAL_TABLET | Freq: Every day | ORAL | 0 refills | Status: DC

## 2024-05-20 NOTE — Telephone Encounter (Signed)
 Patient called this morning for a refill on Wellbutrin . She has not seen you since July and has an appointment with you on 1/28 - at the time of her last appointment she still had a refill and did not need another. Patient is now almost out and would like a bridge to her appointment. Please review and advise, thank you

## 2024-05-20 NOTE — Telephone Encounter (Signed)
 Called patient and advised him that the refill is at the pharmacy

## 2024-05-20 NOTE — Telephone Encounter (Signed)
 Send a 30-day prescription to her pharmacy.  Please inform the patient

## 2024-05-28 ENCOUNTER — Telehealth (HOSPITAL_COMMUNITY): Admitting: Psychiatry

## 2024-05-28 ENCOUNTER — Encounter: Payer: Self-pay | Admitting: Family Medicine

## 2024-05-28 ENCOUNTER — Encounter (HOSPITAL_COMMUNITY): Payer: Self-pay | Admitting: Psychiatry

## 2024-05-28 VITALS — Wt 231.0 lb

## 2024-05-28 DIAGNOSIS — F411 Generalized anxiety disorder: Secondary | ICD-10-CM | POA: Diagnosis not present

## 2024-05-28 DIAGNOSIS — F39 Unspecified mood [affective] disorder: Secondary | ICD-10-CM

## 2024-05-28 DIAGNOSIS — F331 Major depressive disorder, recurrent, moderate: Secondary | ICD-10-CM | POA: Diagnosis not present

## 2024-05-28 MED ORDER — BUPROPION HCL ER (XL) 300 MG PO TB24: 300.0000 mg | ORAL_TABLET | Freq: Every day | ORAL | 0 refills | Status: AC

## 2024-05-28 MED ORDER — HYDROXYZINE PAMOATE 25 MG PO CAPS: 25.0000 mg | ORAL_CAPSULE | Freq: Every day | ORAL | 0 refills | Status: AC
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
## 2024-05-29 NOTE — Telephone Encounter (Signed)
 Please help patient schedule visit with me in next 1-2 months  Suzann Daring, MD  [facility] Medicine Teaching Service

## 2024-06-02 ENCOUNTER — Other Ambulatory Visit (HOSPITAL_COMMUNITY): Payer: Self-pay | Admitting: Psychiatry

## 2024-06-02 DIAGNOSIS — F411 Generalized anxiety disorder: Secondary | ICD-10-CM

## 2024-06-17 ENCOUNTER — Ambulatory Visit: Admitting: Family Medicine

## 2024-08-28 ENCOUNTER — Ambulatory Visit (HOSPITAL_COMMUNITY): Admitting: Psychiatry
